# Patient Record
Sex: Female | Born: 1990 | Race: White | Hispanic: No | Marital: Single | Smoking: Former smoker
Health system: Southern US, Community
[De-identification: ages and names within clinical notes are randomized; demographics above are authoritative.]

## PROBLEM LIST (undated history)

## (undated) DIAGNOSIS — K219 Gastro-esophageal reflux disease without esophagitis: Secondary | ICD-10-CM

## (undated) DIAGNOSIS — O209 Hemorrhage in early pregnancy, unspecified: Principal | ICD-10-CM

## (undated) DIAGNOSIS — F32A Depression, unspecified: Secondary | ICD-10-CM

## (undated) DIAGNOSIS — Z3490 Encounter for supervision of normal pregnancy, unspecified, unspecified trimester: Secondary | ICD-10-CM

## (undated) DIAGNOSIS — G43909 Migraine, unspecified, not intractable, without status migrainosus: Secondary | ICD-10-CM

## (undated) DIAGNOSIS — D649 Anemia, unspecified: Secondary | ICD-10-CM

## (undated) DIAGNOSIS — F329 Major depressive disorder, single episode, unspecified: Secondary | ICD-10-CM

## (undated) DIAGNOSIS — O219 Vomiting of pregnancy, unspecified: Secondary | ICD-10-CM

## (undated) DIAGNOSIS — Z98891 History of uterine scar from previous surgery: Secondary | ICD-10-CM

## (undated) DIAGNOSIS — F419 Anxiety disorder, unspecified: Secondary | ICD-10-CM

## (undated) HISTORY — DX: Hemorrhage in early pregnancy, unspecified: O20.9

## (undated) HISTORY — PX: OTHER SURGICAL HISTORY: SHX169

## (undated) HISTORY — DX: Encounter for supervision of normal pregnancy, unspecified, unspecified trimester: Z34.90

## (undated) HISTORY — DX: Major depressive disorder, single episode, unspecified: F32.9

## (undated) HISTORY — DX: History of uterine scar from previous surgery: Z98.891

## (undated) HISTORY — DX: Vomiting of pregnancy, unspecified: O21.9

## (undated) HISTORY — DX: Depression, unspecified: F32.A

## (undated) HISTORY — DX: Anxiety disorder, unspecified: F41.9

## (undated) HISTORY — DX: Migraine, unspecified, not intractable, without status migrainosus: G43.909

---
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
## 2010-12-20 LAB — CBC
HCT: 32.9 % — ABNORMAL LOW (ref 36.0–46.0)
Hemoglobin: 11.4 g/dL — ABNORMAL LOW (ref 12.0–15.0)
Hemoglobin: 8.5 g/dL — ABNORMAL LOW (ref 12.0–15.0)
MCHC: 34.9 g/dL (ref 30.0–36.0)
MCHC: 35 g/dL (ref 30.0–36.0)
MCV: 90.2 fL (ref 78.0–100.0)
Platelets: 155 10*3/uL (ref 150–400)
Platelets: 217 10*3/uL (ref 150–400)
RDW: 12.7 % (ref 11.5–15.5)
RDW: 13 % (ref 11.5–15.5)
RDW: 13.2 % (ref 11.5–15.5)
RDW: 13.4 % (ref 11.5–15.5)
WBC: 8.4 10*3/uL (ref 4.0–10.5)

## 2010-12-20 LAB — COMPREHENSIVE METABOLIC PANEL
ALT: 19 U/L (ref 0–35)
Albumin: 2.9 g/dL — ABNORMAL LOW (ref 3.5–5.2)
Alkaline Phosphatase: 179 U/L — ABNORMAL HIGH (ref 39–117)
BUN: 5 mg/dL — ABNORMAL LOW (ref 6–23)
GFR calc Af Amer: >60 mL/min (ref >60)
Potassium: 4.3 mEq/L (ref 3.5–5.1)
Sodium: 136 mEq/L (ref 135–145)
Total Protein: 6.2 g/dL (ref 6.0–8.3)

## 2010-12-20 LAB — URIC ACID: Uric Acid, Serum: 4.1 mg/dL (ref 2.4–7.0)

## 2011-01-26 NOTE — Op Note (Signed)
NAMEPETRONELLA, Kathryn Vasquez                  ACCOUNT NO.:  [id number]   MEDICAL RECORD NO.:  [id number]          PATIENT TYPE:  INP   LOCATION:  9319                          FACILITY:  [facility]   PHYSICIAN:  Lazaro Arms, M.D.   DATE OF BIRTH:  [birth year]   DATE OF PROCEDURE:  04/09/2009  DATE OF DISCHARGE:                               OPERATIVE REPORT   PREOPERATIVE DIAGNOSES:  1. Intrauterine pregnancy at [redacted] weeks gestation.  2. Premature and prolonged rupture of membranes.  3. Positive group B strep.  4. Arrest of descent.   POSTOPERATIVE DIAGNOSES:  1. Intrauterine pregnancy at [redacted] weeks gestation.  2. Premature and prolonged rupture of membranes.  3. Positive group B strep.  4. Arrest of descent.   PROCEDURE:  Primary low-transverse cesarean section.   SURGEON:  Lazaro Arms, MD   ANESTHESIA:  Epidural.   FINDINGS:  The patient had been ruptured since approximately 4:30 a.m.  in the morning of April 08, 2009 and was subsequently induced.  She was  begun on penicillin G for group B strep prophylaxis.  The patient  progressed slowly through the first stage of labor and got to an  anterior lift approximately 3 o'clock that day, had some pain issues,  redosed her epidural and began maternal expulsive efforts.  Around 4:45  to 5 p.m., she did poorly with that, kind of pushed on and off for a  couple hours, when we had arrest, redosed her epidural, and had her push  for another window of time from about 2030 to 10 o'clock.  The fetal  heart rate tracing was reassuring throughout with occasional variables  with pushing but nothing of significance.  There was no prolonged fetal  tachycardia.  No evidence of what appeared to be heart rate tracing  consistent with sepsis.  She showed no significant progress after these  efforts and was still at +2 station with caput.  At 2210, I decided to  talk with the patient, we decided to proceed with a cesarean section for  arrest of descent.   Of  a low-transverse hysterotomy incision was delivered a viable female at  2245 with Apgars of 1, 5, and 7.  Cord pH was 7.26, three-vessel cord,  cord blood and cord gas was sent.  Neonatologist was in attendance for  routine neonatal resuscitation, which then turned into positive pressure  O2 and intubation because of poor neonatal breathing effort.  Despite  the fact, the patient got multiple doses of penicillin, the problem  right now is possible sepsis of course with a significantly prolonged  rupture of membranes.  Placenta was normal.  It was sent to Pathology  for evaluation for possible infection as well and again cord gas 7.26.   DESCRIPTION OF PROCEDURE:  The patient was taken to the operating room  where epidural have been dosed up.  She was prepped and draped in usual  sterile fashion.  A Pfannenstiel skin incision was made, carried down  sharply to the rectus fascia, scored in the midline and extended  laterally.  The fascia  taken off the muscle superiorly and inferiorly  without difficulty.  The muscles were divided.  Peritoneal cavity was  entered.  Bladder blade was placed.  A low-transverse hysterotomy  incision was made over this incision was delivered a viable female infant.  The baby was definitely wedged down to the pelvis, took great care in  placing my hand caudad to the fetal vertex, still got a small extension  down the low uterine segment, but not significant.  The infant was  handed to the neonatologist for routine neonatal resuscitation.  Cord  gas again was 7.26.  Cord blood was sent.  Placenta was delivered  spontaneously and sent to pathology.  The uterus was firm, it was closed  in two layers, first being a running interlocking layer, the second  being an imbricating layer, and again I made sure that closure was down  beyond the extension into the lower uterine segment on the left.  The  tubes and ovaries were otherwise normal.  The uterus was placed in the   peritoneal cavity.  Pelvis was irrigated vigorously.  All pedicles were  found be hemostatic.  Muscles and peritoneum reapproximated loosely.  The fascia closed using 0 Vicryl running.  Subcutaneous tissue was made  hemostatic and irrigated  Skin was closed using skin staples.  The  patient tolerated the procedure well.  She experienced 1000 mL of blood  loss, taken to recovery room in good stable condition.  All counts were  correct x3. She received Zithromax after the cord was clamped and will  receive another dose at 12 noon tomorrow.      Lazaro Arms, M.D.  Electronically Signed     LHE/MEDQ  D:  04/09/2009  T:  04/10/2009  Job:  [id number]

## 2011-01-26 NOTE — Discharge Summary (Signed)
Kathryn Vasquez, NOACK                  ACCOUNT NO.:  [id number]   MEDICAL RECORD NO.:  [id number]          PATIENT TYPE:  INP   LOCATION:  9319                          [facility]ILITY:  [facility]   PHYSICIAN:  Horton Chin, MD DATE OF BIRTH:  02/01/1991   DATE OF ADMISSION:  04/08/2009  DATE OF DISCHARGE:  04/13/2009                               DISCHARGE SUMMARY   ADMISSION DIAGNOSIS:  Intrauterine pregnancy at 41 and 2/7 weeks,  premature rupture of membranes.   DISCHARGE DIAGNOSES:  1. Status post primary low transverse cesarean section secondary to      failure to descend after over 40 hours of labor.  2. Routine postpartum course.   PERTINENT LABS:  On admission, the patient had a hemoglobin of 12.2 and  35.1.  On discharge, her hemoglobin was 6.9 with a hematocrit of 19.7.  Of note, the patient was started on ferrous sulfate and had no symptoms  of anemia.   BRIEF HOSPITAL COURSE:  The patient is an 20 year old, gravida 1, para  1, who was status post low transverse cesarean section at 20 and 3  weeks' gestation secondary to failure of descent.  The patient's  procedure was uncomplicated and performed by Dr. Duane Lope.  For  further details of this operation, please refer to separate dictated  operative report.  After the operation, the patient was noted to have a  hemoglobin of 6.9 but was asymptomatic.  There was no signs of bleeding,  pain, or any other symptoms necessitating a transfusion.  The patient  was started on ferrous sulfate and observed.  Her vital signs remained  stable and she was not symptomatic.  Throughout her postpartum course,  the patient did ambulate without difficulty.  She is tolerating regular  diet , was passing flatus and breast and bottle feeding.  On  postoperative day #4, the patient was deemed stable for discharge to  home.   DISCHARGE INSTRUCTIONS:  Routine and the patient is to follow up with  [facility] for her postpartum care.  She is to use  combined oral  contraceptive pills for birth control method and she is bottle feeding  at discharge.  She was told to return to the [facility] or to [facility] for  any further postpartum concerns.  Routine postpartum prescription  medications including the OCP were given to the patient.      Horton Chin, MD  Electronically Signed     UAA/MEDQ  D:  04/13/2009  T:  04/13/2009  Job:  [id number]

## 2012-10-20 LAB — HM PAP SMEAR: HM Pap smear: NORMAL

## 2013-03-27 ENCOUNTER — Ambulatory Visit (INDEPENDENT_AMBULATORY_CARE_PROVIDER_SITE_OTHER): Payer: Medicaid Other | Admitting: Obstetrics & Gynecology

## 2013-03-27 ENCOUNTER — Encounter: Payer: Self-pay | Admitting: Obstetrics & Gynecology

## 2013-03-27 VITALS — BP 122/76 | Ht 63.0 in | Wt 192.5 lb

## 2013-03-27 DIAGNOSIS — Z3200 Encounter for pregnancy test, result unknown: Secondary | ICD-10-CM

## 2013-03-27 DIAGNOSIS — Z3201 Encounter for pregnancy test, result positive: Secondary | ICD-10-CM

## 2013-03-27 LAB — POCT URINE PREGNANCY: Preg Test, Ur: POSITIVE

## 2013-03-27 NOTE — Progress Notes (Signed)
Patient ID: Kathryn Vasquez, female   DOB: [birth year], 22 y.o.   MRN: [id number] Pt here today for UPT, UPT was faintly positive. Pt stated she went to [facility] and was told she was pregnant, pt had been taking BC pills. They drew blood work, but did not do an [place]. Pt advised to sign a release for those records and a QHCG will be drawn today and call back in the morning for results.

## 2013-03-28 ENCOUNTER — Telehealth: Payer: Self-pay | Admitting: *Deleted

## 2013-03-28 NOTE — Telephone Encounter (Signed)
Message copied by Richardson Chiquito on Wed Mar 28, 2013  9:39 AM ------      Message from: Lazaro Arms      Created: Wed Mar 28, 2013  7:24 AM       Patient is early pregnant, needs sonogram in 2 weeks            No more HCGs needed for now            Dr Despina Hidden      ----- Message -----         From: Richardson Chiquito, LPN         Sent: 03/27/2013  10:57 AM           To: Lazaro Arms, MD                   ------

## 2013-03-28 NOTE — Telephone Encounter (Signed)
Pt aware of results and aware to make appointment for [place] in 2 weeks. Pt given an appointment.

## 2013-04-05 ENCOUNTER — Other Ambulatory Visit: Payer: Self-pay | Admitting: Obstetrics & Gynecology

## 2013-04-05 DIAGNOSIS — O3680X0 Pregnancy with inconclusive fetal viability, not applicable or unspecified: Secondary | ICD-10-CM

## 2013-04-10 ENCOUNTER — Ambulatory Visit (INDEPENDENT_AMBULATORY_CARE_PROVIDER_SITE_OTHER): Payer: Medicaid Other | Admitting: Women's Health

## 2013-04-10 ENCOUNTER — Encounter: Payer: Self-pay | Admitting: Women's Health

## 2013-04-10 ENCOUNTER — Ambulatory Visit (INDEPENDENT_AMBULATORY_CARE_PROVIDER_SITE_OTHER): Payer: Medicaid Other

## 2013-04-10 ENCOUNTER — Encounter: Payer: Self-pay | Admitting: Obstetrics & Gynecology

## 2013-04-10 VITALS — BP 120/80 | Ht 63.0 in | Wt 194.0 lb

## 2013-04-10 DIAGNOSIS — O3680X0 Pregnancy with inconclusive fetal viability, not applicable or unspecified: Secondary | ICD-10-CM

## 2013-04-10 DIAGNOSIS — O34219 Maternal care for unspecified type scar from previous cesarean delivery: Secondary | ICD-10-CM

## 2013-04-10 DIAGNOSIS — Z1389 Encounter for screening for other disorder: Secondary | ICD-10-CM

## 2013-04-10 DIAGNOSIS — Z3481 Encounter for supervision of other normal pregnancy, first trimester: Secondary | ICD-10-CM

## 2013-04-10 DIAGNOSIS — O210 Mild hyperemesis gravidarum: Secondary | ICD-10-CM

## 2013-04-10 DIAGNOSIS — Z331 Pregnant state, incidental: Secondary | ICD-10-CM

## 2013-04-10 DIAGNOSIS — Z3480 Encounter for supervision of other normal pregnancy, unspecified trimester: Secondary | ICD-10-CM | POA: Insufficient documentation

## 2013-04-10 LAB — POCT URINALYSIS DIPSTICK
Blood, UA: NEGATIVE
Protein, UA: NEGATIVE

## 2013-04-10 NOTE — Progress Notes (Signed)
Patient ID: Marisela Line, female   DOB: 1991-06-26, 22 y.o.   MRN: [id number] Chanah Tidmore is a 22 y.o. G60P1001 female @ [redacted]w[redacted]d who is here today for confirmatory u/s and visit to discuss results. Was taking COCs, states she didn't miss any pills.  Started prenatal vitamins already. Reports n/v, migraines, cramping. Denies vb.  O: BP 120/80  Ht 5\' 3"  (1.6 m)  Wt 194 lb (87.998 kg)  BMI 34.37 kg/m2  LMP 02/04/2013      U/S IUP @ 5.4wks by CRL  A: Early IUP @ 5.4wks     N/V pregnancy- declined antiemetics     Cramping     Migraines  P: Reviewed n/v relief measures- to call if decides wants antiemetics     Reviewed warning s/s to report     Reviewed migraine prevention measures     F/U 4wks for new OB visit  Cheral Marker, CNM, [facility] 04/10/2013 3:23 PM

## 2013-04-10 NOTE — Progress Notes (Signed)
U/S-single IUP with +FCA noted, CRL c/w 5+4wks EDD 12/07/2013, cx long and closed, bilateral adnexa WNL

## 2013-04-10 NOTE — Patient Instructions (Signed)
Nausea & Vomiting  Have saltine crackers or pretzels by your bed and eat a few bites before you raise your head out of bed in the morning  Eat small frequent meals throughout the day instead of large meals  Drink plenty of fluids throughout the day to stay hydrated, just don't drink a lot of fluids with your meals.  This can make your stomach fill up faster making you feel sick  Do not brush your teeth right after you eat  Products with real ginger are good for nausea, like ginger ale and ginger hard candy Make sure it says made with real ginger!  Sucking on sour candy like lemon heads is also good for nausea  If your prenatal vitamins make you nauseated, take them at night so you will sleep through the nausea  If you feel like you need medicine for the nausea & vomiting please let us know  If you are unable to keep any fluids or food down please let us know   Migraine Headache A migraine headache is an intense, throbbing pain on one or both sides of your head. A migraine can last for 30 minutes to several hours. CAUSES  The exact cause of a migraine headache is not always known. However, a migraine may be caused when nerves in the brain become irritated and release chemicals that cause inflammation. This causes pain. SYMPTOMS  Pain on one or both sides of your head.  Pulsating or throbbing pain.  Severe pain that prevents daily activities.  Pain that is aggravated by any physical activity.  Nausea, vomiting, or both.  Dizziness.  Pain with exposure to bright lights, loud noises, or activity.  General sensitivity to bright lights, loud noises, or smells. Before you get a migraine, you may get warning signs that a migraine is coming (aura). An aura may include:  Seeing flashing lights.  Seeing bright spots, halos, or zig-zag lines.  Having tunnel vision or blurred vision.  Having feelings of numbness or tingling.  Having trouble talking.  Having muscle  weakness. MIGRAINE TRIGGERS  Alcohol.  Smoking.  Stress.  Menstruation.  Aged cheeses.  Foods or drinks that contain nitrates, glutamate, aspartame, or tyramine.  Lack of sleep.  Chocolate.  Caffeine.  Hunger.  Physical exertion.  Fatigue.  Medicines used to treat chest pain (nitroglycerine), birth control pills, estrogen, and some blood pressure medicines. DIAGNOSIS  A migraine headache is often diagnosed based on:  Symptoms.  Physical examination.  A CT scan or MRI of your head. TREATMENT Medicines may be given for pain and nausea. Medicines can also be given to help prevent recurrent migraines.  HOME CARE INSTRUCTIONS  Only take over-the-counter or prescription medicines for pain or discomfort as directed by your caregiver. The use of long-term narcotics is not recommended.  Lie down in a dark, quiet room when you have a migraine.  Keep a journal to find out what may trigger your migraine headaches. For example, write down:  What you eat and drink.  How much sleep you get.  Any change to your diet or medicines.  Limit alcohol consumption.  Quit smoking if you smoke.  Get 7 to 9 hours of sleep, or as recommended by your caregiver.  Limit stress.  Keep lights dim if bright lights bother you and make your migraines worse. SEEK IMMEDIATE MEDICAL CARE IF:   Your migraine becomes severe.  You have a fever.  You have a stiff neck.  You have vision loss.  You  have muscular weakness or loss of muscle control.  You start losing your balance or have trouble walking.  You feel faint or pass out.  You have severe symptoms that are different from your first symptoms. MAKE SURE YOU:   Understand these instructions.  Will watch your condition.  Will get help right away if you are not doing well or get worse. Document Released: 08/30/2005 Document Revised: 11/22/2011 Document Reviewed: 08/20/2011 [facility] Patient Information 2014 [place],  [place].

## 2013-04-13 ENCOUNTER — Telehealth: Payer: Self-pay | Admitting: *Deleted

## 2013-04-13 DIAGNOSIS — Z3481 Encounter for supervision of other normal pregnancy, first trimester: Secondary | ICD-10-CM

## 2013-04-13 NOTE — Telephone Encounter (Signed)
Spotting for the past two to three days, a peach color. Had blood in stool this morning. Pt states she is having some cramping. Has noticed some discharge this morning with a stream of light red blood in it. Pt advised to push fluids and rest and to call us back if things change or get worse. Pt advised that we have a nurse line to call after hours and during the weekend. Pt verbalized understanding.   I spoke with Dr. Emelda Fear about above and he advised for the pt to do all the above and to call us back on Monday morning for an update. I called the pt back and advised her of Dr. Rayna Sexton instructions and she agreed to call us back on Monday morning. Pt verbalized understanding.

## 2013-04-14 ENCOUNTER — Emergency Department (HOSPITAL_COMMUNITY): Payer: Medicaid Other

## 2013-04-14 ENCOUNTER — Emergency Department (HOSPITAL_COMMUNITY)
Admission: EM | Admit: 2013-04-14 | Discharge: 2013-04-14 | Disposition: A | Discharge status: Home / Self Care | Payer: Medicaid Other | Attending: Emergency Medicine | Admitting: Emergency Medicine

## 2013-04-14 ENCOUNTER — Encounter (HOSPITAL_COMMUNITY): Payer: Self-pay | Admitting: *Deleted

## 2013-04-14 DIAGNOSIS — Z91040 Latex allergy status: Secondary | ICD-10-CM | POA: Insufficient documentation

## 2013-04-14 DIAGNOSIS — M549 Dorsalgia, unspecified: Secondary | ICD-10-CM | POA: Insufficient documentation

## 2013-04-14 DIAGNOSIS — Z8659 Personal history of other mental and behavioral disorders: Secondary | ICD-10-CM | POA: Insufficient documentation

## 2013-04-14 DIAGNOSIS — F411 Generalized anxiety disorder: Secondary | ICD-10-CM | POA: Insufficient documentation

## 2013-04-14 DIAGNOSIS — Z87891 Personal history of nicotine dependence: Secondary | ICD-10-CM | POA: Insufficient documentation

## 2013-04-14 DIAGNOSIS — O99340 Other mental disorders complicating pregnancy, unspecified trimester: Secondary | ICD-10-CM | POA: Insufficient documentation

## 2013-04-14 DIAGNOSIS — O210 Mild hyperemesis gravidarum: Secondary | ICD-10-CM | POA: Insufficient documentation

## 2013-04-14 DIAGNOSIS — O9989 Other specified diseases and conditions complicating pregnancy, childbirth and the puerperium: Secondary | ICD-10-CM | POA: Insufficient documentation

## 2013-04-14 DIAGNOSIS — O209 Hemorrhage in early pregnancy, unspecified: Secondary | ICD-10-CM | POA: Insufficient documentation

## 2013-04-14 DIAGNOSIS — Z8679 Personal history of other diseases of the circulatory system: Secondary | ICD-10-CM | POA: Insufficient documentation

## 2013-04-14 DIAGNOSIS — R109 Unspecified abdominal pain: Secondary | ICD-10-CM | POA: Insufficient documentation

## 2013-04-14 LAB — CBC WITH DIFFERENTIAL/PLATELET
Basophils Absolute: 0 10*3/uL (ref 0.0–0.1)
Eosinophils Absolute: 0.1 10*3/uL (ref 0.0–0.7)
Eosinophils Relative: 1 % (ref 0–5)
MCH: 28.9 pg (ref 26.0–34.0)
MCHC: 34.5 g/dL (ref 30.0–36.0)
MCV: 83.7 fL (ref 78.0–100.0)
Platelets: 339 10*3/uL (ref 150–400)
RDW: 12.6 % (ref 11.5–15.5)

## 2013-04-14 LAB — ABO/RH: ABO/RH(D): O POS

## 2013-04-14 NOTE — ED Notes (Signed)
Pt with abd cramping, states pain radiates from abd to pelvis into bilateral thighs, denies passing any clots but blood tinged discharge

## 2013-04-14 NOTE — ED Notes (Signed)
Pt reports she is [redacted] weeks pregnant and has had increasing abdominal cramping and spotting since Wednesday.

## 2013-04-14 NOTE — ED Provider Notes (Signed)
CSN: [id number]     Arrival date & time 04/14/13  1923 History     First MD Initiated Contact with Patient 04/14/13 1950     Chief Complaint  Patient presents with  . Vaginal Bleeding   (Consider location/radiation/quality/duration/timing/severity/associated sxs/prior Treatment) Patient is a 22 y.o. female presenting with vaginal bleeding. The history is provided by the patient.  Vaginal Bleeding Quality:  Bright red Severity:  Mild Onset quality:  Gradual Duration:  3 days Timing:  Intermittent Progression:  Worsening Chronicity:  New Possible pregnancy: yes   Context: during urination and spontaneously   Relieved by:  Nothing Worsened by:  Nothing tried Associated symptoms: abdominal pain, back pain and nausea   Associated symptoms: no dizziness, no dysuria and no fever   Risk factors: gynecological surgery (c/s)   Risk factors: no STD     Kathryn Vasquez is a 22 y.o. G 2, P1 @ [redacted] weeks gestation who presents to the ED with vaginal bleeding. The bleeding started 3 days ago. Associated symptoms include abdominal cramping, nausea and vomiting.    Past Medical History  Diagnosis Date  . Migraine   . Depression   . Anxiety    Past Surgical History  Procedure Laterality Date  . Cesarean section     Family History  Problem Relation Age of Onset  . Cancer Maternal Grandmother     breast  . Hypertension Mother    History  Substance Use Topics  . Smoking status: Former Smoker    Types: Cigarettes  . Smokeless tobacco: Never Used  . Alcohol Use: No   OB History   Grav Para Term Preterm Abortions TAB SAB Ect Mult Living   2 1 1       1      Review of Systems  Constitutional: Negative for fever.  Gastrointestinal: Positive for nausea and abdominal pain.  Genitourinary: Positive for vaginal bleeding. Negative for dysuria.  Musculoskeletal: Positive for back pain.  Neurological: Negative for dizziness.    Allergies  Ceclor; Keflex; and Latex  Home Medications    Current Outpatient Rx  Name  Route  Sig  Dispense  Refill  . prenatal vitamin w/FE, FA (NATACHEW) 29-1 MG CHEW   Oral   Chew 1 tablet by mouth daily at 12 noon.          BP 132/73  Pulse 98  Temp(Src) 99 F (37.2 C) (Oral)  Resp 18  Ht 5\' 3"  (1.6 m)  Wt 193 lb (87.544 kg)  BMI 34.2 kg/m2  SpO2 99%  LMP 02/04/2013 Physical Exam  Nursing note and vitals reviewed. Constitutional: She is oriented to person, place, and time. She appears well-developed and well-nourished. No distress.  HENT:  Head: Normocephalic.  Eyes: EOM are normal.  Neck: Neck supple.  Cardiovascular: Normal rate.   Pulmonary/Chest: Effort normal.  Abdominal: Soft. There is tenderness.  Minimal tenderness lower abdomen with palpation  Genitourinary:  External genitalia without lesions. Scant blood vaginal vault. Cervix closed, long, no adnexal mass palpated, uterus slightly enlarged.  Musculoskeletal: Normal range of motion.  Neurological: She is alert and oriented to person, place, and time. No cranial nerve deficit.  Skin: Skin is warm and dry.  Psychiatric: Her behavior is normal. Her mood appears anxious.    ED Course  US Ob Comp Less 14 Wks  04/14/2013   *RADIOLOGY REPORT*  Clinical Data: Pregnant, pelvic pain  OBSTETRIC <14 WK [place] AND TRANSVAGINAL OB US  Technique:  Both transabdominal and transvaginal ultrasound  examinations were performed for complete evaluation of the gestation as well as the maternal uterus, adnexal regions, and pelvic cul-de-sac.  Transvaginal technique was performed to assess early pregnancy.  Comparison:  None.  Intrauterine gestational sac:  Visualized/normal in shape. Yolk sac: Present Embryo: Present Cardiac Activity: Present Heart Rate: 113 bpm  CRL: 4.7  mm  6 w  1 d        [place] EDC: 12/07/2013  Maternal uterus/adnexae: No subchronic hemorrhage.  Right ovary is within normal limits, measuring 2.3 x 1.2 x 1.6 cm.  Left ovary is within normal limits, measuring 3.3 x 1.3 x 2.9 cm.   No free fluid.  IMPRESSION: Single live intrauterine gestation with estimated gestational age [redacted] weeks 1 day by crown-rump length.   Original Report Authenticated By: Charline Bills, M.D.   US Ob Transvaginal  04/14/2013   *RADIOLOGY REPORT*  Clinical Data: Pregnant, pelvic pain  OBSTETRIC <14 WK [place] AND TRANSVAGINAL OB US  Technique:  Both transabdominal and transvaginal ultrasound examinations were performed for complete evaluation of the gestation as well as the maternal uterus, adnexal regions, and pelvic cul-de-sac.  Transvaginal technique was performed to assess early pregnancy.  Comparison:  None.  Intrauterine gestational sac:  Visualized/normal in shape. Yolk sac: Present Embryo: Present Cardiac Activity: Present Heart Rate: 113 bpm  CRL: 4.7  mm  6 w  1 d        [place] EDC: 12/07/2013  Maternal uterus/adnexae: No subchronic hemorrhage.  Right ovary is within normal limits, measuring 2.3 x 1.2 x 1.6 cm.  Left ovary is within normal limits, measuring 3.3 x 1.3 x 2.9 cm.  No free fluid.  IMPRESSION: Single live intrauterine gestation with estimated gestational age [redacted] weeks 1 day by crown-rump length.   Original Report Authenticated By: Charline Bills, M.D.    Procedures   MDM  22 y.o. female with bleeding in early pregnancy. Viable IUP on ultrasound.  Will instruct patient on threatened AB and she will follow up in the office at [facility]. She will return here as needed. I have reviewed this patient's vital signs, nurses notes, appropriate labs and imaging.  I have discussed findings with the patient and plan of care and she voices understanding.  Janne Napoleon, [place] 04/15/13 2257

## 2013-04-16 ENCOUNTER — Encounter: Payer: Self-pay | Admitting: Adult Health

## 2013-04-16 ENCOUNTER — Ambulatory Visit (INDEPENDENT_AMBULATORY_CARE_PROVIDER_SITE_OTHER): Payer: Medicaid Other | Admitting: Adult Health

## 2013-04-16 ENCOUNTER — Telehealth: Payer: Self-pay | Admitting: Obstetrics and Gynecology

## 2013-04-16 VITALS — BP 130/80 | Wt 195.5 lb

## 2013-04-16 DIAGNOSIS — O9989 Other specified diseases and conditions complicating pregnancy, childbirth and the puerperium: Secondary | ICD-10-CM

## 2013-04-16 DIAGNOSIS — O34219 Maternal care for unspecified type scar from previous cesarean delivery: Secondary | ICD-10-CM

## 2013-04-16 DIAGNOSIS — Z331 Pregnant state, incidental: Secondary | ICD-10-CM

## 2013-04-16 DIAGNOSIS — O209 Hemorrhage in early pregnancy, unspecified: Secondary | ICD-10-CM

## 2013-04-16 DIAGNOSIS — Z1389 Encounter for screening for other disorder: Secondary | ICD-10-CM

## 2013-04-16 HISTORY — DX: Hemorrhage in early pregnancy, unspecified: O20.9

## 2013-04-16 LAB — POCT URINALYSIS DIPSTICK
Ketones, UA: NEGATIVE
Leukocytes, UA: NEGATIVE
Nitrite, UA: NEGATIVE
Protein, UA: NEGATIVE

## 2013-04-16 LAB — GC/CHLAMYDIA PROBE AMP: CT Probe RNA: NEGATIVE

## 2013-04-16 NOTE — Patient Instructions (Addendum)
Vaginal Bleeding During Pregnancy, First Trimester A small amount of bleeding (spotting) is relatively common in early pregnancy. It usually stops on its own. There are many causes for bleeding or spotting in early pregnancy. Some bleeding may be related to the pregnancy and some may not. Cramping with the bleeding is more serious and concerning. Tell your caregiver if you have any vaginal bleeding.  CAUSES   It is normal in most cases.  The pregnancy ends (miscarriage).  The pregnancy may end (threatened miscarriage).  Infection or inflammation of the cervix.  Growths (polyps) on the cervix.  Pregnancy happens outside of the uterus and in a fallopian tube (tubal pregnancy).  Many tiny cysts in the uterus instead of pregnancy tissue (molar pregnancy). SYMPTOMS  Vaginal bleeding or spotting with or without cramps. DIAGNOSIS  To evaluate the pregnancy, your caregiver may:  Do a pelvic exam.  Take blood tests.  Do an ultrasound. It is very important to follow your caregiver's instructions.  TREATMENT   Evaluation of the pregnancy with blood tests and ultrasound.  Bed rest (getting up to use the bathroom only).  Rho-gam immunization if the mother is Rh negative and the father is Rh positive. HOME CARE INSTRUCTIONS   If your caregiver orders bed rest, you may need to make arrangements for the care of other children and for other responsibilities. However, your caregiver may allow you to continue light activity.  Keep track of the number of pads you use each day, how often you change pads and how soaked (saturated) they are. Write this down.  Do not use tampons. Do not douche.  Do not have sexual intercourse or orgasms until approved by your physician.  Save any tissue that you pass for your caregiver to see.  Take medicine for cramps only with your caregiver's permission.  Do not take aspirin because it can make you bleed. SEEK IMMEDIATE MEDICAL CARE IF:   You  experience severe cramps in your stomach, back or belly (abdomen).  You have an oral temperature above 102 F (38.9 C), not controlled by medicine.  You pass large clots or tissue.  Your bleeding increases or you become light-headed, weak or have fainting episodes.  You develop chills.  You are leaking or have a gush of fluid from your vagina.  You pass out while having a bowel movement. That may mean you have a ruptured tubal pregnancy. Document Released: 06/09/2005 Document Revised: 11/22/2011 Document Reviewed: 12/19/2008 [facility] Patient Information 2014 [place], [place]. No sex Follow up as scheduled  Push fluids

## 2013-04-16 NOTE — Progress Notes (Signed)
Cramping and spotting.

## 2013-04-16 NOTE — Telephone Encounter (Signed)
Pt called on Friday, August 1,2014 stating having vaginal spotting and pregnant. Pt went to [facility] on Saturday and told to followup here this week. Called transferred to front staff for an appt to be made with Cyril Mourning, NP.

## 2013-04-16 NOTE — Progress Notes (Signed)
Kathryn Vasquez was seen 8/2 at [facility] for bleeding and had [place] that showed 4.7cm CRL and +FHM [facility] 3/27 and she is follow up today still wiping blood and some cramps US showed +FHM 125.No sex push fluids eat often and try hard candy for nausea declines meds at present.She got pregnant on the pill.Return 8/26 for new OB or before if needed.

## 2013-04-17 NOTE — ED Provider Notes (Signed)
Medical screening examination/treatment/procedure(s) were performed by non-physician practitioner and as supervising physician I was immediately available for consultation/collaboration.   Laray Anger, DO 04/17/13 1247

## 2013-05-05 ENCOUNTER — Encounter (HOSPITAL_COMMUNITY): Payer: Self-pay

## 2013-05-05 ENCOUNTER — Emergency Department (HOSPITAL_COMMUNITY)
Admission: EM | Admit: 2013-05-05 | Discharge: 2013-05-05 | Disposition: A | Discharge status: Home / Self Care | Payer: Medicaid Other | Attending: Emergency Medicine | Admitting: Emergency Medicine

## 2013-05-05 DIAGNOSIS — Z9889 Other specified postprocedural states: Secondary | ICD-10-CM | POA: Insufficient documentation

## 2013-05-05 DIAGNOSIS — O200 Threatened abortion: Secondary | ICD-10-CM

## 2013-05-05 DIAGNOSIS — Z79899 Other long term (current) drug therapy: Secondary | ICD-10-CM | POA: Insufficient documentation

## 2013-05-05 DIAGNOSIS — O209 Hemorrhage in early pregnancy, unspecified: Secondary | ICD-10-CM

## 2013-05-05 DIAGNOSIS — R52 Pain, unspecified: Secondary | ICD-10-CM

## 2013-05-05 DIAGNOSIS — Z8659 Personal history of other mental and behavioral disorders: Secondary | ICD-10-CM | POA: Insufficient documentation

## 2013-05-05 DIAGNOSIS — Z91040 Latex allergy status: Secondary | ICD-10-CM | POA: Insufficient documentation

## 2013-05-05 DIAGNOSIS — Z8679 Personal history of other diseases of the circulatory system: Secondary | ICD-10-CM | POA: Insufficient documentation

## 2013-05-05 HISTORY — DX: Encounter for supervision of normal pregnancy, unspecified, unspecified trimester: Z34.90

## 2013-05-05 LAB — CBC WITH DIFFERENTIAL/PLATELET
Basophils Absolute: 0 10*3/uL (ref 0.0–0.1)
Basophils Relative: 0 % (ref 0–1)
Eosinophils Absolute: 0.1 10*3/uL (ref 0.0–0.7)
Hemoglobin: 12.5 g/dL (ref 12.0–15.0)
MCH: 29 pg (ref 26.0–34.0)
MCHC: 34.9 g/dL (ref 30.0–36.0)
Monocytes Relative: 9 % (ref 3–12)
Neutro Abs: 3.1 10*3/uL (ref 1.7–7.7)
Neutrophils Relative %: 45 % (ref 43–77)
Platelets: 273 10*3/uL (ref 150–400)
RDW: 12.5 % (ref 11.5–15.5)

## 2013-05-05 LAB — URINALYSIS, ROUTINE W REFLEX MICROSCOPIC
Ketones, ur: NEGATIVE mg/dL
Leukocytes, UA: NEGATIVE
Nitrite: NEGATIVE
Urobilinogen, UA: 0.2 mg/dL (ref 0.0–1.0)
pH: 6 (ref 5.0–8.0)

## 2013-05-05 LAB — URINE MICROSCOPIC-ADD ON

## 2013-05-05 NOTE — ED Notes (Signed)
Family states pt passed a clot, made EDP aware. EDP states clot ok, pt tobe discharged &  have [place] in the morning. Follow up w/ OB/GYN Monday.

## 2013-05-05 NOTE — ED Notes (Addendum)
Pt states she is approx [redacted] weeks pregnant and has been bleeding x 4 days, has not contacted her ob/gyn regarding same.  Pt states she is also having abd cramping

## 2013-05-05 NOTE — ED Notes (Signed)
Pt alert & oriented x4, stable gait. Patient given discharge instructions, paperwork & prescription(s). Patient  instructed to stop at the registration desk to finish any additional paperwork. Patient verbalized understanding. Pt left department w/ no further questions. 

## 2013-05-05 NOTE — ED Notes (Signed)
Pt reports pelvic pain on & off for the past four days. Pt has not called ob/gyn, has appt on tues. Pt states bleeding is constant, every time she wipes.

## 2013-05-05 NOTE — ED Provider Notes (Signed)
CSN: [id number]     Arrival date & time 05/05/13  2110 History  This chart was scribed for Hurman Horn, MD, by Yevette Edwards, ED Scribe. This patient was seen in room APA08/APA08 and the patient's care was started at 9:46 PM.   First MD Initiated Contact with Patient 05/05/13 2144     Chief Complaint  Patient presents with  . Vaginal Bleeding  . Pregnancy comp    The history is provided by the patient. No language interpreter was used.   HPI Comments: Kathryn Vasquez is a 22 y.o. female who presents to the Emergency Department complaining of vaginal bleeding which has been occurring for four days. This is her second pregnancy; she is nine weeks along. She denies passing any tissue or clots, and she denies experiencing any fever, emesis, diarrhea, syncope, or light-headedness. Three weeks ago, she experienced light bleeding which spontaneously resolved after three days of intermittent, light bleeding. The pt does not recall bleeding during her first pregnancy. She also does not recall receiving any Rh shots during her pregnancy.   Opos blood type confirmed by lab result. [place] confirmed IUP at [redacted] weeks EGA. She has an appointment in three days with her ob-gyn.  Past Medical History  Diagnosis Date  . Migraine   . Depression   . Anxiety   . First trimester bleeding 04/16/2013  . Pregnancy    Past Surgical History  Procedure Laterality Date  . Cesarean section     Family History  Problem Relation Age of Onset  . Cancer Maternal Grandmother     breast  . Hypertension Mother    History  Substance Use Topics  . Smoking status: Never Smoker   . Smokeless tobacco: Never Used  . Alcohol Use: No   OB History   Grav Para Term Preterm Abortions TAB SAB Ect Mult Living   2 1 1       1      Review of Systems  A complete 10 system review of systems was obtained, and all systems were negative except where indicated in the HPI and PE.   Allergies  Ceclor; Keflex; and Latex  Home  Medications   Current Outpatient Rx  Name  Route  Sig  Dispense  Refill  . prenatal vitamin w/FE, FA (NATACHEW) 29-1 MG CHEW   Oral   Chew 1 tablet by mouth daily at 12 noon.          Triage Vitals: BP 139/85  Pulse 90  Temp(Src) 99.1 F (37.3 C) (Oral)  Resp 16  Ht 5\' 3"  (1.6 m)  Wt 196 lb (88.905 kg)  BMI 34.73 kg/m2  SpO2 100%  LMP 02/04/2013  Physical Exam  Nursing note and vitals reviewed. Constitutional:  Awake, alert, nontoxic appearance.  HENT:  Head: Atraumatic.  Eyes: Right eye exhibits no discharge. Left eye exhibits no discharge.  Neck: Neck supple.  Cardiovascular: Normal rate and regular rhythm.   No murmur heard. Pulmonary/Chest: Effort normal and breath sounds normal. No respiratory distress. She exhibits no tenderness.  Abdominal: Soft. Bowel sounds are normal. There is no tenderness. There is no rebound.  Genitourinary:  Performed a manual pelvic exam. Chaperone present. Scant blood on examination glove. Cervix closed. Mild, diffused tenderness.  Musculoskeletal: She exhibits no tenderness.  Baseline ROM, no obvious new focal weakness.  Neurological:  Mental status and motor strength appears baseline for patient and situation.  Skin: No rash noted.  Psychiatric: She has a normal mood and affect.  ED Course   DIAGNOSTIC STUDIES:  Oxygen Saturation is 100% on room air, normal by my interpretation.    COORDINATION OF CARE:  9:58 PM- Patient / Family understand and agree with initial ED impression and plan with expectations set for ED visit.  Procedures (including critical care time)  9:55 PM-  Performed an abdominal ultra sound.   10:12 PM- Re-performed an abdominal ultra sound.  10:15 PM- Performed a bimanual pelvic exam. Chaperone present. Scant blood on examination glove. Cervix closed. Mild, diffuse tenderness.  Labs Reviewed  URINALYSIS, ROUTINE W REFLEX MICROSCOPIC - Abnormal; Notable for the following:    Specific Gravity, Urine  <1.005 (*)    Hgb urine dipstick MODERATE (*)    All other components within normal limits  CBC WITH DIFFERENTIAL - Abnormal; Notable for the following:    HCT 35.8 (*)    All other components within normal limits  URINE MICROSCOPIC-ADD ON - Abnormal; Notable for the following:    Squamous Epithelial / LPF FEW (*)    All other components within normal limits   US Ob Comp Less 14 Wks  05/06/2013   *RADIOLOGY REPORT*  Clinical Data: Pain, vaginal bleeding, pregnant  OBSTETRIC <14 WK [place] AND TRANSVAGINAL OB US  Technique:  Both transabdominal and transvaginal ultrasound examinations were performed for complete evaluation of the gestation as well as the maternal uterus, adnexal regions, and pelvic cul-de-sac.  Transvaginal technique was performed to assess early pregnancy.  Comparison:  04/14/2013  Intrauterine gestational sac:  Visualized/normal in shape. Yolk sac: Present Embryo: Present Cardiac Activity: Not identified Heart Rate: N/A bpm  MSD: 24.6 mm  7 w 4 d CRL: 11.4  mm  7 w  2 d             [place] EDC: 12/21/2013  Maternal uterus/adnexae: Chorioamniotic separation. Small amount of subchorionic hemorrhage. No uterine mass. Ovaries and adnexae unremarkable.  IMPRESSION: Single intrauterine gestation measured at 7 weeks 2 days EGA by crown-rump length. However no fetal cardiac activity is identified compatible with fetal nonviability.   Original Report Authenticated By: Ulyses Southward, M.D.   US Ob Transvaginal  05/06/2013   *RADIOLOGY REPORT*  Clinical Data: Pain, vaginal bleeding, pregnant  OBSTETRIC <14 WK [place] AND TRANSVAGINAL OB US  Technique:  Both transabdominal and transvaginal ultrasound examinations were performed for complete evaluation of the gestation as well as the maternal uterus, adnexal regions, and pelvic cul-de-sac.  Transvaginal technique was performed to assess early pregnancy.  Comparison:  04/14/2013  Intrauterine gestational sac:  Visualized/normal in shape. Yolk sac: Present Embryo:  Present Cardiac Activity: Not identified Heart Rate: N/A bpm  MSD: 24.6 mm  7 w 4 d CRL: 11.4  mm  7 w  2 d             [place] EDC: 12/21/2013  Maternal uterus/adnexae: Chorioamniotic separation. Small amount of subchorionic hemorrhage. No uterine mass. Ovaries and adnexae unremarkable.  IMPRESSION: Single intrauterine gestation measured at 7 weeks 2 days EGA by crown-rump length. However no fetal cardiac activity is identified compatible with fetal nonviability.   Original Report Authenticated By: Ulyses Southward, M.D.   1. Threatened miscarriage in early pregnancy   2. First trimester bleeding   3. Pain     MDM   I doubt any other EMC precluding discharge at this time including, but not necessarily limited to the following:ectopic.  I personally performed the services described in this documentation, which was scribed in my presence. The recorded information has  been reviewed and is accurate.   Hurman Horn, MD 05/06/13 [phone]

## 2013-05-06 ENCOUNTER — Other Ambulatory Visit (HOSPITAL_COMMUNITY): Payer: Self-pay | Admitting: Emergency Medicine

## 2013-05-06 ENCOUNTER — Ambulatory Visit (HOSPITAL_COMMUNITY)
Admit: 2013-05-06 | Discharge: 2013-05-06 | Disposition: A | Discharge status: Home / Self Care | Payer: Medicaid Other | Attending: Emergency Medicine | Admitting: Emergency Medicine

## 2013-05-06 DIAGNOSIS — R52 Pain, unspecified: Secondary | ICD-10-CM

## 2013-05-06 DIAGNOSIS — O209 Hemorrhage in early pregnancy, unspecified: Secondary | ICD-10-CM | POA: Insufficient documentation

## 2013-05-06 DIAGNOSIS — R109 Unspecified abdominal pain: Secondary | ICD-10-CM | POA: Insufficient documentation

## 2013-05-06 NOTE — ED Provider Notes (Signed)
Pt returns for outpatient [place] for pregnancy and vaginal bleeding. She is G2P1Ab0, about 9 weeks and states they have already heard the baby heart beat in Dr Forestine Chute office. She reports she has been having lower abdominal cramping like period cramping and when she wipes she sees blood. Today she started to wear a pad and has only had one. She has an appt in 3 days with Dr Despina Hidden. She was seen in the ED last night.   Pt given results of her test, advised to call Dr Forestine Chute office today to let him know about her [place] report. Advised to come back if she gets heavy bleeding or worsening pain.   US Ob Comp Less 14 Wks US Ob Transvaginal  05/06/2013   *RADIOLOGY REPORT*  Clinical Data: Pain, vaginal bleeding, pregnant  OBSTETRIC <14 WK [place] AND TRANSVAGINAL OB US  Technique:  Both transabdominal and transvaginal ultrasound examinations were performed for complete evaluation of the gestation as well as the maternal uterus, adnexal regions, and pelvic cul-de-sac.  Transvaginal technique was performed to assess early pregnancy.  Comparison:  04/14/2013  Intrauterine gestational sac:  Visualized/normal in shape. Yolk sac: Present Embryo: Present Cardiac Activity: Not identified Heart Rate: N/A bpm  MSD: 24.6 mm  7 w 4 d CRL: 11.4  mm  7 w  2 d             [place] EDC: 12/21/2013  Maternal uterus/adnexae: Chorioamniotic separation. Small amount of subchorionic hemorrhage. No uterine mass. Ovaries and adnexae unremarkable.  IMPRESSION: Single intrauterine gestation measured at 7 weeks 2 days EGA by crown-rump length. However no fetal cardiac activity is identified compatible with fetal nonviability.   Original Report Authenticated By: Ulyses Southward, M.D.    Ward Givens, MD 05/06/13 1248

## 2013-05-08 ENCOUNTER — Ambulatory Visit (INDEPENDENT_AMBULATORY_CARE_PROVIDER_SITE_OTHER): Payer: Medicaid Other | Admitting: Advanced Practice Midwife

## 2013-05-08 VITALS — BP 128/84 | Wt 195.0 lb

## 2013-05-08 DIAGNOSIS — O021 Missed abortion: Secondary | ICD-10-CM

## 2013-05-08 DIAGNOSIS — Z3480 Encounter for supervision of other normal pregnancy, unspecified trimester: Secondary | ICD-10-CM

## 2013-05-08 DIAGNOSIS — O209 Hemorrhage in early pregnancy, unspecified: Secondary | ICD-10-CM

## 2013-05-08 DIAGNOSIS — Z1389 Encounter for screening for other disorder: Secondary | ICD-10-CM

## 2013-05-08 DIAGNOSIS — Z331 Pregnant state, incidental: Secondary | ICD-10-CM

## 2013-05-08 LAB — POCT URINALYSIS DIPSTICK
Blood, UA: 4
Glucose, UA: NEGATIVE

## 2013-05-08 MED ORDER — HYDROCODONE-ACETAMINOPHEN 5-325 MG PO TABS: 1.0000 | ORAL_TABLET | Freq: Four times a day (QID) | ORAL | Status: DC | PRN

## 2013-05-08 MED ORDER — MISOPROSTOL 200 MCG PO TABS: 800.0000 ug | ORAL_TABLET | Freq: Once | ORAL | Status: DC

## 2013-05-08 NOTE — Progress Notes (Signed)
Pt states seen at [facility] ER for vaginal bleeding and abdominal pain. Pt states was told at [facility] ER was unable to find heart beat. U/S reviewed and no FCA.  Vaginal u/s today shows 7 week size fetus w/o FCA. Small amount of vag bleeding (although she says it has been heavy at times over the past 2 days) and cx closed. Options (wait, cytotec,D&C) discussed.  Pt opted for cytotec per vagina.  Rx for Norco 5/325 #30 and instructions given. Pt aware that it may take up to 48 hours and we may need to repeat cytotec.

## 2013-05-08 NOTE — Patient Instructions (Addendum)

## 2013-05-22 ENCOUNTER — Encounter (INDEPENDENT_AMBULATORY_CARE_PROVIDER_SITE_OTHER): Payer: Medicaid Other | Admitting: Adult Health

## 2013-05-22 ENCOUNTER — Ambulatory Visit (INDEPENDENT_AMBULATORY_CARE_PROVIDER_SITE_OTHER): Payer: Medicaid Other | Admitting: Adult Health

## 2013-05-22 ENCOUNTER — Encounter: Payer: Self-pay | Admitting: Adult Health

## 2013-05-22 VITALS — BP 130/90 | Ht 63.0 in | Wt 200.0 lb

## 2013-05-22 DIAGNOSIS — O039 Complete or unspecified spontaneous abortion without complication: Secondary | ICD-10-CM

## 2013-05-22 NOTE — Progress Notes (Signed)
Subjective:     Patient ID: Kathryn Vasquez, female   DOB: 03-26-1991, 22 y.o.   MRN: [id number]  HPI Kathryn Vasquez is in sp miscarriage, after taking Cytotec.still bleeding light and some cramps like menstrual cramps.Blood type O+.  Review of Systems See HPI Reviewed past medical,surgical, social and family history. Reviewed medications and allergies.     Objective:   Physical Exam BP 130/90  Ht 5\' 3"  (1.6 m)  Wt 200 lb (90.719 kg)  BMI 35.44 kg/m2  LMP 02/04/2013 Skin warm and dry.Pelvic: external genitalia is normal in appearance, vagina: brown discharge without odor, cervix:smooth and bulbous,negative CMT. uterus: normal size, shape and contour, tender, no masses felt, adnexa: no masses or tenderness noted.     Assessment:     Miscarriage    Plan:     Check QHCG Follow up labs in am No sex yet

## 2013-05-22 NOTE — Patient Instructions (Addendum)
No sex yet Follow up in 1 week Call in am for labs Miscarriage A miscarriage is the sudden loss of an unborn baby (fetus) before the 20th week of pregnancy. Most miscarriages happen in the first 3 months of pregnancy. Sometimes, it happens before a woman even knows she is pregnant. A miscarriage is also called a "spontaneous miscarriage" or "early pregnancy loss." Having a miscarriage can be an emotional experience. Talk with your caregiver about any questions you may have about miscarrying, the grieving process, and your future pregnancy plans. CAUSES   Problems with the fetal chromosomes that make it impossible for the baby to develop normally. Problems with the baby's genes or chromosomes are most often the result of errors that occur, by chance, as the embryo divides and grows. The problems are not inherited from the parents.  Infection of the cervix or uterus.   Hormone problems.   Problems with the cervix, such as having an incompetent cervix. This is when the tissue in the cervix is not strong enough to hold the pregnancy.   Problems with the uterus, such as an abnormally shaped uterus, uterine fibroids, or congenital abnormalities.   Certain medical conditions.   Smoking, drinking alcohol, or taking illegal drugs.   Trauma.  Often, the cause of a miscarriage is unknown.  SYMPTOMS   Vaginal bleeding or spotting, with or without cramps or pain.  Pain or cramping in the abdomen or lower back.  Passing fluid, tissue, or blood clots from the vagina. DIAGNOSIS  Your caregiver will perform a physical exam. You may also have an ultrasound to confirm the miscarriage. Blood or urine tests may also be ordered. TREATMENT   Sometimes, treatment is not necessary if you naturally pass all the fetal tissue that was in the uterus. If some of the fetus or placenta remains in the body (incomplete miscarriage), tissue left behind may become infected and must be removed. Usually, a  dilation and curettage (D and C) procedure is performed. During a D and C procedure, the cervix is widened (dilated) and any remaining fetal or placental tissue is gently removed from the uterus.  Antibiotic medicines are prescribed if there is an infection. Other medicines may be given to reduce the size of the uterus (contract) if there is a lot of bleeding.  If you have Rh negative blood and your baby was Rh positive, you will need a Rh immunoglobulin shot. This shot will protect any future baby from having Rh blood problems in future pregnancies. HOME CARE INSTRUCTIONS   Your caregiver may order bed rest or may allow you to continue light activity. Resume activity as directed by your caregiver.  Have someone help with home and family responsibilities during this time.   Keep track of the number of sanitary pads you use each day and how soaked (saturated) they are. Write down this information.   Do not use tampons. Do not douche or have sexual intercourse until approved by your caregiver.   Only take over-the-counter or prescription medicines for pain or discomfort as directed by your caregiver.   Do not take aspirin. Aspirin can cause bleeding.   Keep all follow-up appointments with your caregiver.   If you or your partner have problems with grieving, talk to your caregiver or seek counseling to help cope with the pregnancy loss. Allow enough time to grieve before trying to get pregnant again.  SEEK IMMEDIATE MEDICAL CARE IF:   You have severe cramps or pain in your back or  abdomen.  You have a fever.  You pass large blood clots (walnut-sized or larger) ortissue from your vagina. Save any tissue for your caregiver to inspect.   Your bleeding increases.   You have a thick, bad-smelling vaginal discharge.  You become lightheaded, weak, or you faint.   You have chills.  MAKE SURE YOU:  Understand these instructions.  Will watch your condition.  Will get help  right away if you are not doing well or get worse. Document Released: 02/23/2001 Document Revised: 02/29/2012 Document Reviewed: 10/19/2011 [facility] Patient Information 2014 [place], [place].

## 2013-05-23 ENCOUNTER — Telehealth: Payer: Self-pay | Admitting: Adult Health

## 2013-05-23 NOTE — Telephone Encounter (Signed)
Left message that [facility] 12.4

## 2013-05-24 NOTE — Progress Notes (Signed)
This encounter was created in error - please disregard.

## 2013-05-29 ENCOUNTER — Encounter: Payer: Medicaid Other | Admitting: Women's Health

## 2013-05-29 ENCOUNTER — Ambulatory Visit (INDEPENDENT_AMBULATORY_CARE_PROVIDER_SITE_OTHER): Payer: Medicaid Other | Admitting: Women's Health

## 2013-05-29 ENCOUNTER — Encounter: Payer: Self-pay | Admitting: Women's Health

## 2013-05-29 VITALS — BP 140/84 | Ht 63.0 in | Wt 198.6 lb

## 2013-05-29 DIAGNOSIS — O039 Complete or unspecified spontaneous abortion without complication: Secondary | ICD-10-CM

## 2013-05-29 NOTE — Progress Notes (Signed)
Patient ID: Kathryn Vasquez, female   DOB: 12/21/1990, 22 y.o.   MRN: [id number] Kathryn Vasquez is a 23 y.o. G18P1011 female who is s/p complete SAB, here for f/u. Had round of cytotec and passed fetal tissue ~2wks ago. bHCG 12.4 on 05/22/13.  States bleeding stopped yesterday, and is no longer having cramps.  Pregnancy is desired.    O: BP 140/84  Ht 5\' 3"  (1.6 m)  Wt 198 lb 9.6 oz (90.084 kg)  BMI 35.19 kg/m2  LMP 02/04/2013  Breastfeeding? Unknown   A: s/p SAB     Desires pregnancy soon      P: Recommended to wait at least 3 months inbetween SAB and trying to conceive again     To continue PNV, no etoh/drugs/tobacco      Return prn  Cheral Marker, CNM, [facility] 05/29/2013 12:14 PM

## 2013-05-29 NOTE — Patient Instructions (Signed)
Preparing for Pregnancy Preparing for pregnancy (preconceptual care) by getting counseling and information from your caregiver before getting pregnant is a good idea. It will help you and your baby have a better chance to have a healthy, safe pregnancy and delivery of your baby. Make an appointment with your caregiver to talk about your health, medical, and family history and how to prepare yourself before getting pregnant. Your caregiver will do a complete physical exam and a Pap test. They will want to know:  About you, your spouse or partner, and your family's medical and genetic history.  If you are eating a balanced diet and drinking enough fluids.  What vitamins and mineral supplements you are taking. This includes taking folic acid before getting pregnant to help prevent birth defects.  What medications you are taking including prescription, over-the-counter and herbal medications.  If there is any substance abuse like alcohol, smoking, and illegal drugs.  If there is any mental or physical domestic violence.  If there is any risk of sexually transmitted disease between you and your partner.  What immunizations and vaccinations you have had and what you may need before getting pregnant.  If you should get tested for HIV infection.  If there is any exposure to chemical or toxic substances at home or work.  If there are medical problems you have that need to be treated and kept under control before getting pregnant such as diabetes, high blood pressure or others.  If there were any past surgeries, pregnancies and problems with them.  What your current weight is and to set a goal as to how much weight you should gain while pregnant. Also, they will check if you should lose or gain weight before getting pregnant.  What is your exercise routine and what it is safe when you are pregnant.  If there are any physical disabilities that need to be addressed.  About spacing your  pregnancies when there are other children.  If there is a financial problem that may affect you having a child. After talking about the above points with your caregiver, your caregiver will give you advice on how to help treat and work with you on solving any issues, if necessary, before getting pregnant. The goal is to have a healthy and safe pregnancy for you and your baby. You should keep an accurate record of your menstrual periods because it will help in determining your due date. Immunizations that you should have before getting pregnant:   Regular measles, German measles (rubella) and mumps.  Tetanus and diphtheria.  Chickenpox, if not immune.  Herpes zoster (Varicella) if not immune.  Human papilloma virus vaccine (HPV) between the age of 9 and 26 years old.  Hepatitis A vaccine.  Hepatitis B vaccine.  Influenza vaccine.  Pneumococcal vaccine (pneumonia). You should avoid getting pregnant for one month after getting vaccinated with a live virus vaccine such as German measles (rubella) vaccine. Other immunizations may be necessary depending on where you live, such as malaria. Ask your caregiver if any other immunizations are needed for you. HOME CARE INSTRUCTIONS   Follow the advice of your caregiver.  Before getting pregnant:  Begin taking vitamins, supplements, and 0.4 milligrams folic acid daily.  Get your immunizations up-to-date.  Get help from a nutrition counselor if you do not understand what a balanced diet is, need help with a special medical diet or if you need help to lose or gain weight.  Begin exercising.  Stop smoking, taking illegal drugs,   and drinking alcoholic beverages.  Get counseling if there is and type of domestic violence.  Get checked for sexually transmitted diseases including HIV.  Get any medical problems under control (diabetes, high blood pressure, convulsions, asthma or others).  Resolve any financial concerns.  Be sure you and  your spouse or partner are ready to have a baby.  Keep an accurate record of your menstrual periods. Document Released: 08/12/2008 Document Revised: 11/22/2011 Document Reviewed: 08/12/2008 [employer] Patient Information 2014 [employer].  

## 2013-06-04 NOTE — Progress Notes (Signed)
This encounter was created in error - please disregard.

## 2013-11-07 ENCOUNTER — Ambulatory Visit (INDEPENDENT_AMBULATORY_CARE_PROVIDER_SITE_OTHER): Payer: Medicaid Other

## 2013-11-07 ENCOUNTER — Other Ambulatory Visit: Payer: Self-pay | Admitting: Obstetrics & Gynecology

## 2013-11-07 ENCOUNTER — Encounter: Payer: Self-pay | Admitting: Radiology

## 2013-11-07 ENCOUNTER — Encounter (INDEPENDENT_AMBULATORY_CARE_PROVIDER_SITE_OTHER): Payer: Self-pay

## 2013-11-07 DIAGNOSIS — O09299 Supervision of pregnancy with other poor reproductive or obstetric history, unspecified trimester: Secondary | ICD-10-CM

## 2013-11-07 DIAGNOSIS — O3680X0 Pregnancy with inconclusive fetal viability, not applicable or unspecified: Secondary | ICD-10-CM

## 2013-11-07 NOTE — Progress Notes (Signed)
U/S-transvaginal u/s performed, single IU gestational sac noted with +YS noted within, GS meas c/w 5+5wks, cx appears closed, bilateral adnexa appears wnl with C.L. Noted on Lt, no free fluid noted within pelvis

## 2013-11-12 ENCOUNTER — Ambulatory Visit (INDEPENDENT_AMBULATORY_CARE_PROVIDER_SITE_OTHER): Payer: Medicaid Other | Admitting: Adult Health

## 2013-11-12 ENCOUNTER — Encounter: Payer: Self-pay | Admitting: Adult Health

## 2013-11-12 VITALS — BP 110/74 | Wt 217.0 lb

## 2013-11-12 DIAGNOSIS — O200 Threatened abortion: Secondary | ICD-10-CM

## 2013-11-12 DIAGNOSIS — O09299 Supervision of pregnancy with other poor reproductive or obstetric history, unspecified trimester: Secondary | ICD-10-CM

## 2013-11-12 DIAGNOSIS — Z1389 Encounter for screening for other disorder: Secondary | ICD-10-CM

## 2013-11-12 DIAGNOSIS — O209 Hemorrhage in early pregnancy, unspecified: Secondary | ICD-10-CM

## 2013-11-12 DIAGNOSIS — Z3490 Encounter for supervision of normal pregnancy, unspecified, unspecified trimester: Secondary | ICD-10-CM

## 2013-11-12 DIAGNOSIS — Z331 Pregnant state, incidental: Secondary | ICD-10-CM

## 2013-11-12 HISTORY — DX: Encounter for supervision of normal pregnancy, unspecified, unspecified trimester: Z34.90

## 2013-11-12 HISTORY — DX: Hemorrhage in early pregnancy, unspecified: O20.9

## 2013-11-12 LAB — POCT URINALYSIS DIPSTICK
GLUCOSE UA: NEGATIVE
Ketones, UA: NEGATIVE
Leukocytes, UA: NEGATIVE
NITRITE UA: NEGATIVE
PROTEIN UA: NEGATIVE
RBC UA: 2

## 2013-11-12 NOTE — Patient Instructions (Signed)
Vaginal Bleeding During Pregnancy, First Trimester A small amount of bleeding (spotting) from the vagina is relatively common in early pregnancy. It usually stops on its own. Various things may cause bleeding or spotting in early pregnancy. Some bleeding may be related to the pregnancy, and some may not. In most cases, the bleeding is normal and is not a problem. However, bleeding can also be a sign of something serious. Be sure to tell your health care provider about any vaginal bleeding right away. Some possible causes of vaginal bleeding during the first trimester include:  Infection or inflammation of the cervix.  Growths (polyps) on the cervix.  Miscarriage or threatened miscarriage.  Pregnancy tissue has developed outside of the uterus and in a fallopian tube (tubal pregnancy).  Tiny cysts have developed in the uterus instead of pregnancy tissue (molar pregnancy). HOME CARE INSTRUCTIONS  Watch your condition for any changes. The following actions may help to lessen any discomfort you are feeling:  Follow your health care provider's instructions for limiting your activity. If your health care provider orders bed rest, you may need to stay in bed and only get up to use the bathroom. However, your health care provider may allow you to continue light activity.  If needed, make plans for someone to help with your regular activities and responsibilities while you are on bed rest.  Keep track of the number of pads you use each day, how often you change pads, and how soaked (saturated) they are. Write this down.  Do not use tampons. Do not douche.  Do not have sexual intercourse or orgasms until approved by your health care provider.  If you pass any tissue from your vagina, save the tissue so you can show it to your health care provider.  Only take over-the-counter or prescription medicines as directed by your health care provider.  Do not take aspirin because it can make you  bleed.  Keep all follow-up appointments as directed by your health care provider. SEEK MEDICAL CARE IF:  You have any vaginal bleeding during any part of your pregnancy.  You have cramps or labor pains. SEEK IMMEDIATE MEDICAL CARE IF:   You have severe cramps in your back or belly (abdomen).  You have a fever, not controlled by medicine.  You pass large clots or tissue from your vagina.  Your bleeding increases.  You feel lightheaded or weak, or you have fainting episodes.  You have chills.  You are leaking fluid or have a gush of fluid from your vagina.  You pass out while having a bowel movement. MAKE SURE YOU:  Understand these instructions.  Will watch your condition.  Will get help right away if you are not doing well or get worse. Document Released: 06/09/2005 Document Revised: 06/20/2013 Document Reviewed: 05/07/2013 [facility] Patient Information 2014 [place], [place]. Push fluids  No sex Return on Friday for US as scheduled

## 2013-11-12 NOTE — Progress Notes (Signed)
Pt worked in today for bright pink spotting, and cramping. Cramping started yesterday and spotting started today. No sex in 2 days and BM early this morning.

## 2013-11-12 NOTE — Progress Notes (Signed)
Had bright pink bleeding this am, spotted til about 1 hour ago,had some cramps, on exam no bleeding noted, US showed +YS +fetal pole and FCA at 107 on US. Increase fluids, no sex keep appt Friday.

## 2013-11-15 ENCOUNTER — Other Ambulatory Visit: Payer: Self-pay | Admitting: Obstetrics & Gynecology

## 2013-11-15 DIAGNOSIS — O3680X0 Pregnancy with inconclusive fetal viability, not applicable or unspecified: Secondary | ICD-10-CM

## 2013-11-16 ENCOUNTER — Other Ambulatory Visit: Payer: Self-pay | Admitting: Obstetrics & Gynecology

## 2013-11-16 ENCOUNTER — Ambulatory Visit (INDEPENDENT_AMBULATORY_CARE_PROVIDER_SITE_OTHER): Payer: Medicaid Other

## 2013-11-16 DIAGNOSIS — O3680X0 Pregnancy with inconclusive fetal viability, not applicable or unspecified: Secondary | ICD-10-CM

## 2013-11-16 DIAGNOSIS — O09299 Supervision of pregnancy with other poor reproductive or obstetric history, unspecified trimester: Secondary | ICD-10-CM

## 2013-11-16 NOTE — Progress Notes (Signed)
U/S(7+0wks)-single IUP with +FCA notedm, FHR-130 bpm, cx appears closed, bilateral adnexa wnl, CRL c/w previous u/s measurements

## 2013-11-26 ENCOUNTER — Ambulatory Visit (INDEPENDENT_AMBULATORY_CARE_PROVIDER_SITE_OTHER): Payer: Medicaid Other | Admitting: Adult Health

## 2013-11-26 ENCOUNTER — Encounter: Payer: Self-pay | Admitting: Adult Health

## 2013-11-26 VITALS — BP 120/80 | Wt 212.0 lb

## 2013-11-26 DIAGNOSIS — Z3480 Encounter for supervision of other normal pregnancy, unspecified trimester: Secondary | ICD-10-CM

## 2013-11-26 DIAGNOSIS — O219 Vomiting of pregnancy, unspecified: Secondary | ICD-10-CM | POA: Insufficient documentation

## 2013-11-26 DIAGNOSIS — O09299 Supervision of pregnancy with other poor reproductive or obstetric history, unspecified trimester: Secondary | ICD-10-CM

## 2013-11-26 DIAGNOSIS — Z1389 Encounter for screening for other disorder: Secondary | ICD-10-CM

## 2013-11-26 DIAGNOSIS — Z331 Pregnant state, incidental: Secondary | ICD-10-CM

## 2013-11-26 DIAGNOSIS — Z3490 Encounter for supervision of normal pregnancy, unspecified, unspecified trimester: Secondary | ICD-10-CM

## 2013-11-26 DIAGNOSIS — Z98891 History of uterine scar from previous surgery: Secondary | ICD-10-CM | POA: Insufficient documentation

## 2013-11-26 HISTORY — DX: Encounter for supervision of normal pregnancy, unspecified, unspecified trimester: Z34.90

## 2013-11-26 HISTORY — DX: History of uterine scar from previous surgery: Z98.891

## 2013-11-26 HISTORY — DX: Vomiting of pregnancy, unspecified: O21.9

## 2013-11-26 LAB — CBC
HCT: 35.8 % — ABNORMAL LOW (ref 36.0–46.0)
Hemoglobin: 12.4 g/dL (ref 12.0–15.0)
MCH: 28.6 pg (ref 26.0–34.0)
MCHC: 34.6 g/dL (ref 30.0–36.0)
MCV: 82.7 fL (ref 78.0–100.0)
PLATELETS: 320 10*3/uL (ref 150–400)
RBC: 4.33 MIL/uL (ref 3.87–5.11)
RDW: 13.5 % (ref 11.5–15.5)
WBC: 6.5 10*3/uL (ref 4.0–10.5)

## 2013-11-26 MED ORDER — DOXYLAMINE-PYRIDOXINE 10-10 MG PO TBEC: DELAYED_RELEASE_TABLET | ORAL | Status: DC

## 2013-11-26 NOTE — Progress Notes (Signed)
Pt states that she has had some cramping but denies any bleeding since about 2-3 weeks ago. Pt given CCNC form and lab consent to read over and sign. Pt states that she is having terrible nausea and vomiting, not currently taking nausea medication.

## 2013-11-26 NOTE — Progress Notes (Signed)
Subjective:  Kathryn Vasquez is a 23 y.o. 533P1011 Caucasian female at 71335w3d by US being seen today for her first obstetrical visit.  Her obstetrical history is significant for Had C section .  Pregnancy history fully reviewed.  Patient reports nausea and vomiting, will try diclegis. Denies vb, cramping, uti s/s, abnormal/malodorous vag d/c, or vulvovaginal itching/irritation.  BP 120/80  Wt 212 lb (96.163 kg)  LMP 02/04/2013  HISTORY: OB History  Gravida Para Term Preterm AB SAB TAB Ectopic Multiple Living  3 1 1  0 1 1 0 0 0 1    # Outcome Date GA Lbr Len/2nd Weight Sex Delivery Anes PTL Lv  3 CUR           2 TRM 2010 6577w0d  8 lb 1 oz (3.657 kg) M LTCS   Y  1 SAB              Past Medical History  Diagnosis Date  . Migraine   . Depression   . Anxiety   . First trimester bleeding 04/16/2013  . Pregnancy   . Pregnant 11/12/2013  . Vaginal bleeding in pregnant patient at less than [redacted] weeks gestation 11/12/2013  . Supervision of normal pregnancy 11/26/2013   Past Surgical History  Procedure Laterality Date  . Cesarean section     Family History  Problem Relation Age of Onset  . Cancer Maternal Grandmother     breast  . Hypertension Mother     Exam   System:     General: Well developed & nourished, no acute distress   Skin: Warm & dry, normal coloration and turgor, no rashes   Neurologic: Alert & oriented, normal mood   Cardiovascular: Regular rate & rhythm   Respiratory: Effort & rate normal, LCTAB, acyanotic   Abdomen: Soft, non tender   Extremities: normal strength, tone   Pelvic Exam:    Perineum: deferred   Vulva: deferred   Vagina:  deferred   Cervix: deferred   Uterus: Normal size/shape/contour for GA   Request pap from Dr Reuel Boomaniel FHR: 171 via US   Assessment:   Pregnancy: G3P1011 Patient Active Problem List   Diagnosis Date Noted  . Supervision of normal pregnancy 11/26/2013  . Pregnant 11/12/2013  . Vaginal bleeding in pregnant patient at less than [redacted]  weeks gestation 11/12/2013  . Missed abortion 05/08/2013    44335w3d G3P1011 New OB visit     Plan:  Initial labs drawn Continue prenatal vitamins Problem list reviewed and updated Reviewed n/v relief measures and warning s/s to report Reviewed recommended weight gain based on pre-gravid BMI Encouraged well-balanced diet Genetic Screening discussed Integrated Screen: requested Cystic fibrosis screening discussed requested Ultrasound discussed; fetal survey: requested Follow up in 4 weeks for IT/NT and see Harvel RicksKim  Nathanyal Ashmead A. Wrenna Saks, NP 11/26/2013 11:55 AM

## 2013-11-26 NOTE — Patient Instructions (Signed)
Pregnancy - First Trimester During sexual intercourse, millions of sperm go into the vagina. Only 1 sperm will penetrate and fertilize the female egg while it is in the Fallopian tube. One week later, the fertilized egg implants into the wall of the uterus. An embryo begins to develop into a baby. At 6 to 8 weeks, the eyes and face are formed and the heartbeat can be seen on ultrasound. At the end of 12 weeks (first trimester), all the baby's organs are formed. Now that you are pregnant, you will want to do everything you can to have a healthy baby. Two of the most important things are to get good prenatal care and follow your caregiver's instructions. Prenatal care is all the medical care you receive before the baby's birth. It is given to prevent, find, and treat problems during the pregnancy and childbirth. PRENATAL EXAMS During prenatal visits, your weight, blood pressure, and urine are checMorning Sickness Morning sickness is when you feel sick to your stomach (nauseous) during pregnancy. This nauseous feeling may or may not come with vomiting. It often occurs in the morning but can be a problem any time of day. Morning sickness is most common during the first trimester, but it may continue throughout pregnancy. While morning sickness is unpleasant, it is usually harmless unless you develop severe and continual vomiting (hyperemesis gravidarum). This condition requires more intense treatment.  CAUSES  The cause of morning sickness is not completely known but seems to be related to normal hormonal changes that occur in pregnancy. RISK FACTORS You are at greater risk if you: Experienced nausea or vomiting before your pregnancy. Had morning sickness during a previous pregnancy. Are pregnant with more than one baby, such as twins. TREATMENT  Do not use any medicines (prescription, over-the-counter, or herbal) for morning sickness without first talking to your health care provider. Your health care  provider may prescribe or recommend: Vitamin B6 supplements. Anti-nausea medicines. The herbal medicine ginger. HOME CARE INSTRUCTIONS  Only take over-the-counter or prescription medicines as directed by your health care provider. Taking multivitamins before getting pregnant can prevent or decrease the severity of morning sickness in most women.  Eat a piece of dry toast or unsalted crackers before getting out of bed in the morning.  Eat five or six small meals a day.  Eat dry and bland foods (rice, baked potato). Foods high in carbohydrates are often helpful. Do not drink liquids with your meals. Drink liquids between meals.  Avoid greasy, fatty, and spicy foods.  Get someone to cook for you if the smell of any food causes nausea and vomiting.  If you feel nauseous after taking prenatal vitamins, take the vitamins at night or with a snack. Snack on protein foods (nuts, yogurt, cheese) between meals if you are hungry.  Eat unsweetened gelatins for desserts.  Wearing an acupressure wristband (worn for sea sickness) may be helpful.  Acupuncture may be helpful.  Do not smoke.  Get a humidifier to keep the air in your house free of odors.  Get plenty of fresh air. SEEK MEDICAL CARE IF:  Your home remedies are not working, and you need medicine. You feel dizzy or lightheaded. You are losing weight. SEEK IMMEDIATE MEDICAL CARE IF:  You have persistent and uncontrolled nausea and vomiting. You pass out (faint). Document Released: 10/21/2006 Document Revised: 05/02/2013 Document Reviewed: 02/14/2013 [facility] Patient Information 2014 [place], [place].  ed. This is done to make sure you are healthy and progressing normally during the pregnancy.  A pregnant woman should gain 25 to 35 pounds during the pregnancy. However, if you are overweight or underweight, your caregiver will advise you regarding your weight.  Your caregiver will ask and answer questions for you.  Blood  work, cervical cultures, other necessary tests, and a Pap test are done during your prenatal exams. These tests are done to check on your health and the probable health of your baby. Tests are strongly recommended and done for HIV with your permission. This is the virus that causes AIDS. These tests are done because medicines can be given to help prevent your baby from being born with this infection should you have been infected without knowing it. Blood work is also used to find out your blood type, previous infections, and follow your blood levels (hemoglobin).  Low hemoglobin (anemia) is common during pregnancy. Iron and vitamins are given to help prevent this. Later in the pregnancy, blood tests for diabetes will be done along with any other tests if any problems develop.  You may need other tests to make sure you and the baby are doing well. CHANGES DURING THE FIRST TRIMESTER  Your body goes through many changes during pregnancy. They vary from person to person. Talk to your caregiver about changes you notice and are concerned about. Changes can include:  Your menstrual period stops.  The egg and sperm carry the genes that determine what you look like. Genes from you and your partner are forming a baby. The female genes determine whether the baby is a boy or a girl.  Your body increases in girth and you may feel bloated.  Feeling sick to your stomach (nauseous) and throwing up (vomiting). If the vomiting is uncontrollable, call your caregiver.  Your breasts will begin to enlarge and become tender.  Your nipples may stick out more and become darker.  The need to urinate more. Painful urination may mean you have a bladder infection.  Tiring easily.  Loss of appetite.  Cravings for certain kinds of food.  At first, you may gain or lose a couple of pounds.  You may have changes in your emotions from day to day (excited to be pregnant or concerned something may go wrong with the pregnancy  and baby).  You may have more vivid and strange dreams. HOME CARE INSTRUCTIONS   It is very important to avoid all smoking, alcohol and non-prescribed drugs during your pregnancy. These affect the formation and growth of the baby. Avoid chemicals while pregnant to ensure the delivery of a healthy infant.  Start your prenatal visits by the 12th week of pregnancy. They are usually scheduled monthly at first, then more often in the last 2 months before delivery. Keep your caregiver's appointments. Follow your caregiver's instructions regarding medicine use, blood and lab tests, exercise, and diet.  During pregnancy, you are providing food for you and your baby. Eat regular, well-balanced meals. Choose foods such as meat, fish, milk and other low fat dairy products, vegetables, fruits, and whole-grain breads and cereals. Your caregiver will tell you of the ideal weight gain.  You can help morning sickness by keeping soda crackers at the bedside. Eat a couple before arising in the morning. You may want to use the crackers without salt on them.  Eating 4 to 5 small meals rather than 3 large meals a day also may help the nausea and vomiting.  Drinking liquids between meals instead of during meals also seems to help nausea and vomiting.  A physical  sexual relationship may be continued throughout pregnancy if there are no other problems. Problems may be early (premature) leaking of amniotic fluid from the membranes, vaginal bleeding, or belly (abdominal) pain.  Exercise regularly if there are no restrictions. Check with your caregiver or physical therapist if you are unsure of the safety of some of your exercises. Greater weight gain will occur in the last 2 trimesters of pregnancy. Exercising will help:  Control your weight.  Keep you in shape.  Prepare you for labor and delivery.  Help you lose your pregnancy weight after you deliver your baby.  Wear a good support or jogging bra for breast  tenderness during pregnancy. This may help if worn during sleep too.  Ask when prenatal classes are available. Begin classes when they are offered.  Do not use hot tubs, steam rooms, or saunas.  Wear your seat belt when driving. This protects you and your baby if you are in an accident.  Avoid raw meat, uncooked cheese, cat litter boxes, and soil used by cats throughout the pregnancy. These carry germs that can cause birth defects in the baby.  The first trimester is a good time to visit your dentist for your dental health. Getting your teeth cleaned is okay. Use a softer toothbrush and brush gently during pregnancy.  Ask for help if you have financial, counseling, or nutritional needs during pregnancy. Your caregiver will be able to offer counseling for these needs as well as refer you for other special needs.  Do not take any medicines or herbs unless told by your caregiver.  Inform your caregiver if there is any mental or physical domestic violence.  Make a list of emergency phone numbers of family, friends, hospital, and police and fire departments.  Write down your questions. Take them to your prenatal visit.  Do not douche.  Do not cross your legs.  If you have to stand for long periods of time, rotate you feet or take small steps in a circle.  You may have more vaginal secretions that may require a sanitary pad. Do not use tampons or scented sanitary pads. MEDICINES AND DRUG USE IN PREGNANCY  Take prenatal vitamins as directed. The vitamin should contain 1 milligram of folic acid. Keep all vitamins out of reach of children. Only a couple vitamins or tablets containing iron may be fatal to a baby or young child when ingested.  Avoid use of all medicines, including herbs, over-the-counter medicines, not prescribed or suggested by your caregiver. Only take over-the-counter or prescription medicines for pain, discomfort, or fever as directed by your caregiver. Do not use aspirin,  ibuprofen, or naproxen unless directed by your caregiver.  Let your caregiver also know about herbs you may be using.  Alcohol is related to a number of birth defects. This includes fetal alcohol syndrome. All alcohol, in any form, should be avoided completely. Smoking will cause low birth rate and premature babies.  Street or illegal drugs are very harmful to the baby. They are absolutely forbidden. A baby born to an addicted mother will be addicted at birth. The baby will go through the same withdrawal an adult does.  Let your caregiver know about any medicines that you have to take and for what reason you take them. SEEK MEDICAL CARE IF:  You have any concerns or worries during your pregnancy. It is better to call with your questions if you feel they cannot wait, rather than worry about them. SEEK IMMEDIATE MEDICAL CARE IF:  An unexplained oral temperature above 102 F (38.9 C) develops, or as your caregiver suggests.  You have leaking of fluid from the vagina (birth canal). If leaking membranes are suspected, take your temperature and inform your caregiver of this when you call.  There is vaginal spotting or bleeding. Notify your caregiver of the amount and how many pads are used.  You develop a bad smelling vaginal discharge with a change in the color.  You continue to feel sick to your stomach (nauseated) and have no relief from remedies suggested. You vomit blood or coffee ground-like materials.  You lose more than 2 pounds of weight in 1 week.  You gain more than 2 pounds of weight in 1 week and you notice swelling of your face, hands, feet, or legs.  You gain 5 pounds or more in 1 week (even if you do not have swelling of your hands, face, legs, or feet).  You get exposed to [place] measles and have never had them.  You are exposed to fifth disease or chickenpox.  You develop belly (abdominal) pain. Round ligament discomfort is a common non-cancerous (benign) cause of  abdominal pain in pregnancy. Your caregiver still must evaluate this.  You develop headache, fever, diarrhea, pain with urination, or shortness of breath.  You fall or are in a car accident or have any kind of trauma.  There is mental or physical violence in your home. Document Released: 08/24/2001 Document Revised: 05/24/2012 Document Reviewed: 02/25/2009 [facility] Patient Information 2014 [place], [place]. Try iclegis and return in 4 weeks for IT/NT

## 2013-11-27 ENCOUNTER — Encounter: Payer: Self-pay | Admitting: Adult Health

## 2013-11-27 LAB — ABO AND RH: Rh Type: POSITIVE

## 2013-11-27 LAB — HEPATITIS B SURFACE ANTIGEN: Hepatitis B Surface Ag: NEGATIVE

## 2013-11-27 LAB — TSH: TSH: 1.524 u[IU]/mL (ref 0.350–4.500)

## 2013-11-27 LAB — GC/CHLAMYDIA PROBE AMP
CT PROBE, AMP APTIMA: NEGATIVE
GC PROBE AMP APTIMA: NEGATIVE

## 2013-11-27 LAB — ANTIBODY SCREEN: Antibody Screen: NEGATIVE

## 2013-11-27 LAB — RPR

## 2013-11-27 LAB — VARICELLA ZOSTER ANTIBODY, IGG: Varicella IgG: 2765 Index — ABNORMAL HIGH (ref <135.00)

## 2013-11-27 LAB — HIV ANTIBODY (ROUTINE TESTING W REFLEX): HIV: NONREACTIVE

## 2013-11-27 LAB — RUBELLA SCREEN: RUBELLA: 3.68 {index} — AB (ref <0.90)

## 2013-11-28 LAB — POCT URINALYSIS DIPSTICK
GLUCOSE UA: NEGATIVE
Protein, UA: NEGATIVE

## 2013-11-28 LAB — CYSTIC FIBROSIS DIAGNOSTIC STUDY: Result Summary:: DETECTED — AB

## 2013-11-29 ENCOUNTER — Encounter: Payer: Self-pay | Admitting: Adult Health

## 2013-11-29 ENCOUNTER — Telehealth: Payer: Self-pay | Admitting: Adult Health

## 2013-11-29 NOTE — Telephone Encounter (Signed)
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

## 2013-12-10 ENCOUNTER — Encounter: Payer: Self-pay | Admitting: Adult Health

## 2013-12-10 NOTE — Progress Notes (Unsigned)
Patient ID: Kathryn DeedJosie Mae Vasquez, female   DOB: 01-09-1991, 23 y.o.   MRN: [id number] PT CALLED, STATED SHE IS [redacted] WEEKS PREGNANT, HAVING "SEVERE" PAINS.  I ASKED PT IF SHE WAS SPOTTING SHE STATED SHE DIDN'T KNOW.  I SPOKE WITH ASHLEY TRAVIS, LPN AND SHE STATED [facility]AV[facility]ERE [facility].  THE [facility] WAS T[facility] AND [facility].   YJeannie Fend

## 2013-12-20 ENCOUNTER — Other Ambulatory Visit: Payer: Self-pay | Admitting: Adult Health

## 2013-12-20 DIAGNOSIS — Z36 Encounter for antenatal screening of mother: Secondary | ICD-10-CM

## 2013-12-24 ENCOUNTER — Other Ambulatory Visit: Payer: Medicaid Other

## 2013-12-24 ENCOUNTER — Encounter: Payer: Medicaid Other | Admitting: Women's Health

## 2014-01-01 ENCOUNTER — Ambulatory Visit (INDEPENDENT_AMBULATORY_CARE_PROVIDER_SITE_OTHER): Payer: Medicaid Other

## 2014-01-01 ENCOUNTER — Ambulatory Visit (INDEPENDENT_AMBULATORY_CARE_PROVIDER_SITE_OTHER): Payer: Medicaid Other | Admitting: Women's Health

## 2014-01-01 ENCOUNTER — Encounter: Payer: Self-pay | Admitting: Women's Health

## 2014-01-01 VITALS — BP 120/60 | Wt 207.6 lb

## 2014-01-01 DIAGNOSIS — O09899 Supervision of other high risk pregnancies, unspecified trimester: Secondary | ICD-10-CM

## 2014-01-01 DIAGNOSIS — Z3480 Encounter for supervision of other normal pregnancy, unspecified trimester: Secondary | ICD-10-CM

## 2014-01-01 DIAGNOSIS — O09299 Supervision of pregnancy with other poor reproductive or obstetric history, unspecified trimester: Secondary | ICD-10-CM

## 2014-01-01 DIAGNOSIS — R350 Frequency of micturition: Secondary | ICD-10-CM

## 2014-01-01 DIAGNOSIS — O34219 Maternal care for unspecified type scar from previous cesarean delivery: Secondary | ICD-10-CM

## 2014-01-01 DIAGNOSIS — Z331 Pregnant state, incidental: Secondary | ICD-10-CM

## 2014-01-01 DIAGNOSIS — O2390 Unspecified genitourinary tract infection in pregnancy, unspecified trimester: Secondary | ICD-10-CM

## 2014-01-01 DIAGNOSIS — O26899 Other specified pregnancy related conditions, unspecified trimester: Secondary | ICD-10-CM

## 2014-01-01 DIAGNOSIS — R109 Unspecified abdominal pain: Secondary | ICD-10-CM

## 2014-01-01 DIAGNOSIS — Z3490 Encounter for supervision of normal pregnancy, unspecified, unspecified trimester: Secondary | ICD-10-CM

## 2014-01-01 DIAGNOSIS — Z36 Encounter for antenatal screening of mother: Secondary | ICD-10-CM

## 2014-01-01 DIAGNOSIS — N898 Other specified noninflammatory disorders of vagina: Secondary | ICD-10-CM

## 2014-01-01 DIAGNOSIS — Z1389 Encounter for screening for other disorder: Secondary | ICD-10-CM

## 2014-01-01 DIAGNOSIS — Z98891 History of uterine scar from previous surgery: Secondary | ICD-10-CM

## 2014-01-01 DIAGNOSIS — Z141 Cystic fibrosis carrier: Secondary | ICD-10-CM

## 2014-01-01 LAB — POCT WET PREP (WET MOUNT): CLUE CELLS WET PREP WHIFF POC: POSITIVE

## 2014-01-01 LAB — POCT URINALYSIS DIPSTICK
Blood, UA: NEGATIVE
Glucose, UA: NEGATIVE
Ketones, UA: NEGATIVE
LEUKOCYTES UA: NEGATIVE
Nitrite, UA: NEGATIVE
PROTEIN UA: NEGATIVE

## 2014-01-01 MED ORDER — METRONIDAZOLE 500 MG PO TABS: 500.0000 mg | ORAL_TABLET | Freq: Two times a day (BID) | ORAL | Status: DC

## 2014-01-01 NOTE — Addendum Note (Signed)
Addended by: Shawna ClampBOOKER, KIMBERLY R on: 01/01/2014 11:55 AM   Modules accepted: Orders

## 2014-01-01 NOTE — Progress Notes (Addendum)
Denies lof, vb. +urinary frequency/urgency- will send cx.  Cramping, increased d/c.  Spec exam: very tender- states she is always like this w/ exams- not w/ sex though, small amt slightly malodorous white d/c, cx visually closed. Deferred sve, cx L&C on u/s, and pt would be unable to tolerate. Wet prep +clues, rx flagyl. Increase fluids. [facility], some generalized aches/pains since. Has lost 12lb since beginning of pregnancy, states she is eating well- no vomiting. H/O c/s for FTD after couple hrs pushing, 8lb1oz, states he was OP. Discussed vbac, consent given to take home and review. 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 Reviewed warning s/s to report.  All questions answered. F/U in 4wks for 2nd IT and visit.

## 2014-01-01 NOTE — Progress Notes (Signed)
Clear vaginal discharge with no odor.  

## 2014-01-01 NOTE — Progress Notes (Signed)
U/S(13+4wks)-active fetus,  CRL c/w dates, cx appears closed (3.4cm), bilateral adnexa appears wnl, posterior Gr 0 placenta noted, NB present, NT- 1.1008mm, FHR-168 bpm

## 2014-01-01 NOTE — Patient Instructions (Signed)
Pregnancy - First Trimester °During sexual intercourse, millions of sperm go into the vagina. Only 1 sperm will penetrate and fertilize the female egg while it is in the Fallopian tube. One week later, the fertilized egg implants into the wall of the uterus. An embryo begins to develop into a baby. At 6 to 8 weeks, the eyes and face are formed and the heartbeat can be seen on ultrasound. At the end of 12 weeks (first trimester), all the baby's organs are formed. Now that you are pregnant, you will want to do everything you can to have a healthy baby. Two of the most important things are to get good prenatal care and follow your caregiver's instructions. Prenatal care is all the medical care you receive before the baby's birth. It is given to prevent, find, and treat problems during the pregnancy and childbirth. °PRENATAL EXAMS °· During prenatal visits, your weight, blood pressure, and urine are checked. This is done to make sure you are healthy and progressing normally during the pregnancy. °· A pregnant woman should gain 25 to 35 pounds during the pregnancy. However, if you are overweight or underweight, your caregiver will advise you regarding your weight. °· Your caregiver will ask and answer questions for you. °· Blood work, cervical cultures, other necessary tests, and a Pap test are done during your prenatal exams. These tests are done to check on your health and the probable health of your baby. Tests are strongly recommended and done for HIV with your permission. This is the virus that causes AIDS. These tests are done because medicines can be given to help prevent your baby from being born with this infection should you have been infected without knowing it. Blood work is also used to find out your blood type, previous infections, and follow your blood levels (hemoglobin). °· Low hemoglobin (anemia) is common during pregnancy. Iron and vitamins are given to help prevent this. Later in the pregnancy, blood  tests for diabetes will be done along with any other tests if any problems develop. °· You may need other tests to make sure you and the baby are doing well. °CHANGES DURING THE FIRST TRIMESTER  °Your body goes through many changes during pregnancy. They vary from person to person. Talk to your caregiver about changes you notice and are concerned about. Changes can include: °· Your menstrual period stops. °· The egg and sperm carry the genes that determine what you look like. Genes from you and your partner are forming a baby. The female genes determine whether the baby is a boy or a girl. °· Your body increases in girth and you may feel bloated. °· Feeling sick to your stomach (nauseous) and throwing up (vomiting). If the vomiting is uncontrollable, call your caregiver. °· Your breasts will begin to enlarge and become tender. °· Your nipples may stick out more and become darker. °· The need to urinate more. Painful urination may mean you have a bladder infection. °· Tiring easily. °· Loss of appetite. °· Cravings for certain kinds of food. °· At first, you may gain or lose a couple of pounds. °· You may have changes in your emotions from day to day (excited to be pregnant or concerned something may go wrong with the pregnancy and baby). °· You may have more vivid and strange dreams. °HOME CARE INSTRUCTIONS  °· It is very important to avoid all smoking, alcohol and non-prescribed drugs during your pregnancy. These affect the formation and growth of the baby.   Avoid chemicals while pregnant to ensure the delivery of a healthy infant. °· Start your prenatal visits by the 12th week of pregnancy. They are usually scheduled monthly at first, then more often in the last 2 months before delivery. Keep your caregiver's appointments. Follow your caregiver's instructions regarding medicine use, blood and lab tests, exercise, and diet. °· During pregnancy, you are providing food for you and your baby. Eat regular, well-balanced  meals. Choose foods such as meat, fish, milk and other low fat dairy products, vegetables, fruits, and whole-grain breads and cereals. Your caregiver will tell you of the ideal weight gain. °· You can help morning sickness by keeping soda crackers at the bedside. Eat a couple before arising in the morning. You may want to use the crackers without salt on them. °· Eating 4 to 5 small meals rather than 3 large meals a day also may help the nausea and vomiting. °· Drinking liquids between meals instead of during meals also seems to help nausea and vomiting. °· A physical sexual relationship may be continued throughout pregnancy if there are no other problems. Problems may be early (premature) leaking of amniotic fluid from the membranes, vaginal bleeding, or belly (abdominal) pain. °· Exercise regularly if there are no restrictions. Check with your caregiver or physical therapist if you are unsure of the safety of some of your exercises. Greater weight gain will occur in the last 2 trimesters of pregnancy. Exercising will help: °· Control your weight. °· Keep you in shape. °· Prepare you for labor and delivery. °· Help you lose your pregnancy weight after you deliver your baby. °· Wear a good support or jogging bra for breast tenderness during pregnancy. This may help if worn during sleep too. °· Ask when prenatal classes are available. Begin classes when they are offered. °· Do not use hot tubs, steam rooms, or saunas. °· Wear your seat belt when driving. This protects you and your baby if you are in an accident. °· Avoid raw meat, uncooked cheese, cat litter boxes, and soil used by cats throughout the pregnancy. These carry germs that can cause birth defects in the baby. °· The first trimester is a good time to visit your dentist for your dental health. Getting your teeth cleaned is okay. Use a softer toothbrush and brush gently during pregnancy. °· Ask for help if you have financial, counseling, or nutritional needs  during pregnancy. Your caregiver will be able to offer counseling for these needs as well as refer you for other special needs. °· Do not take any medicines or herbs unless told by your caregiver. °· Inform your caregiver if there is any mental or physical domestic violence. °· Make a list of emergency phone numbers of family, friends, hospital, and police and fire departments. °· Write down your questions. Take them to your prenatal visit. °· Do not douche. °· Do not cross your legs. °· If you have to stand for long periods of time, rotate you feet or take small steps in a circle. °· You may have more vaginal secretions that may require a sanitary pad. Do not use tampons or scented sanitary pads. °MEDICINES AND DRUG USE IN PREGNANCY °· Take prenatal vitamins as directed. The vitamin should contain 1 milligram of folic acid. Keep all vitamins out of reach of children. Only a couple vitamins or tablets containing iron may be fatal to a baby or young child when ingested. °· Avoid use of all medicines, including herbs, over-the-counter medicines, not   prescribed or suggested by your caregiver. Only take over-the-counter or prescription medicines for pain, discomfort, or fever as directed by your caregiver. Do not use aspirin, ibuprofen, or naproxen unless directed by your caregiver. °· Let your caregiver also know about herbs you may be using. °· Alcohol is related to a number of birth defects. This includes fetal alcohol syndrome. All alcohol, in any form, should be avoided completely. Smoking will cause low birth rate and premature babies. °· Street or illegal drugs are very harmful to the baby. They are absolutely forbidden. A baby born to an addicted mother will be addicted at birth. The baby will go through the same withdrawal an adult does. °· Let your caregiver know about any medicines that you have to take and for what reason you take them. °SEEK MEDICAL CARE IF:  °You have any concerns or worries during your  pregnancy. It is better to call with your questions if you feel they cannot wait, rather than worry about them. °SEEK IMMEDIATE MEDICAL CARE IF:  °· An unexplained oral temperature above 102° F (38.9° C) develops, or as your caregiver suggests. °· You have leaking of fluid from the vagina (birth canal). If leaking membranes are suspected, take your temperature and inform your caregiver of this when you call. °· There is vaginal spotting or bleeding. Notify your caregiver of the amount and how many pads are used. °· You develop a bad smelling vaginal discharge with a change in the color. °· You continue to feel sick to your stomach (nauseated) and have no relief from remedies suggested. You vomit blood or coffee ground-like materials. °· You lose more than 2 pounds of weight in 1 week. °· You gain more than 2 pounds of weight in 1 week and you notice swelling of your face, hands, feet, or legs. °· You gain 5 pounds or more in 1 week (even if you do not have swelling of your hands, face, legs, or feet). °· You get exposed to German measles and have never had them. °· You are exposed to fifth disease or chickenpox. °· You develop belly (abdominal) pain. Round ligament discomfort is a common non-cancerous (benign) cause of abdominal pain in pregnancy. Your caregiver still must evaluate this. °· You develop headache, fever, diarrhea, pain with urination, or shortness of breath. °· You fall or are in a car accident or have any kind of trauma. °· There is mental or physical violence in your home. °Document Released: 08/24/2001 Document Revised: 05/24/2012 Document Reviewed: 02/25/2009 °ExitCare® Patient Information ©2014 [employer]. °Bacterial Vaginosis °Bacterial vaginosis is a vaginal infection that occurs when the normal balance of bacteria in the vagina is disrupted. It results from an overgrowth of certain bacteria. This is the most common vaginal infection in women of childbearing age. Treatment is important to  prevent complications, especially in pregnant women, as it can cause a premature delivery. °CAUSES  °Bacterial vaginosis is caused by an increase in harmful bacteria that are normally present in smaller amounts in the vagina. Several different kinds of bacteria can cause bacterial vaginosis. However, the reason that the condition develops is not fully understood. °RISK FACTORS °Certain activities or behaviors can put you at an increased risk of developing bacterial vaginosis, including: °· Having a new sex partner or multiple sex partners. °· Douching. °· Using an intrauterine device (IUD) for contraception. °Women do not get bacterial vaginosis from toilet seats, bedding, swimming pools, or contact with objects around them. °SIGNS AND SYMPTOMS  °Some women   with bacterial vaginosis have no signs or symptoms. Common symptoms include: °· Grey vaginal discharge. °· A fishlike odor with discharge, especially after sexual intercourse. °· Itching or burning of the vagina and vulva. °· Burning or pain with urination. °DIAGNOSIS  °Your health care provider will take a medical history and examine the vagina for signs of bacterial vaginosis. A sample of vaginal fluid may be taken. Your health care provider will look at this sample under a microscope to check for bacteria and abnormal cells. A vaginal pH test may also be done.  °TREATMENT  °Bacterial vaginosis may be treated with antibiotic medicines. These may be given in the form of a pill or a vaginal cream. A second round of antibiotics may be prescribed if the condition comes back after treatment.  °HOME CARE INSTRUCTIONS  °· Only take over-the-counter or prescription medicines as directed by your health care provider. °· If antibiotic medicine was prescribed, take it as directed. Make sure you finish it even if you start to feel better. °· Do not have sex until treatment is completed. °· Tell all sexual partners that you have a vaginal infection. They should see their  health care provider and be treated if they have problems, such as a mild rash or itching. °· Practice safe sex by using condoms and only having one sex partner. °SEEK MEDICAL CARE IF:  °· Your symptoms are not improving after 3 days of treatment. °· You have increased discharge or pain. °· You have a fever. °MAKE SURE YOU:  °· Understand these instructions. °· Will watch your condition. °· Will get help right away if you are not doing well or get worse. °FOR MORE INFORMATION  °Centers for Disease Control and Prevention, Division of STD Prevention: www.cdc.gov/std °American Sexual Health Association (ASHA): www.ashastd.org  °Document Released: 08/30/2005 Document Revised: 06/20/2013 Document Reviewed: 04/11/2013 °ExitCare® Patient Information ©2014 [employer]. ° °

## 2014-01-02 LAB — GC/CHLAMYDIA PROBE AMP
CT Probe RNA: NEGATIVE
GC PROBE AMP APTIMA: NEGATIVE

## 2014-01-02 LAB — URINE CULTURE
COLONY COUNT: NO GROWTH
ORGANISM ID, BACTERIA: NO GROWTH

## 2014-01-07 ENCOUNTER — Encounter: Payer: Self-pay | Admitting: *Deleted

## 2014-01-07 LAB — MATERNAL SCREEN, INTEGRATED #1

## 2014-01-14 ENCOUNTER — Ambulatory Visit (INDEPENDENT_AMBULATORY_CARE_PROVIDER_SITE_OTHER): Payer: Medicaid Other | Admitting: Women's Health

## 2014-01-14 ENCOUNTER — Telehealth: Payer: Self-pay | Admitting: *Deleted

## 2014-01-14 ENCOUNTER — Encounter: Payer: Self-pay | Admitting: Women's Health

## 2014-01-14 VITALS — BP 132/72 | Wt 205.5 lb

## 2014-01-14 DIAGNOSIS — Z1389 Encounter for screening for other disorder: Secondary | ICD-10-CM

## 2014-01-14 DIAGNOSIS — O2390 Unspecified genitourinary tract infection in pregnancy, unspecified trimester: Secondary | ICD-10-CM

## 2014-01-14 DIAGNOSIS — Z331 Pregnant state, incidental: Secondary | ICD-10-CM

## 2014-01-14 DIAGNOSIS — R82998 Other abnormal findings in urine: Secondary | ICD-10-CM

## 2014-01-14 DIAGNOSIS — O34219 Maternal care for unspecified type scar from previous cesarean delivery: Secondary | ICD-10-CM

## 2014-01-14 DIAGNOSIS — Z3480 Encounter for supervision of other normal pregnancy, unspecified trimester: Secondary | ICD-10-CM

## 2014-01-14 DIAGNOSIS — O09299 Supervision of pregnancy with other poor reproductive or obstetric history, unspecified trimester: Secondary | ICD-10-CM

## 2014-01-14 DIAGNOSIS — O9989 Other specified diseases and conditions complicating pregnancy, childbirth and the puerperium: Secondary | ICD-10-CM

## 2014-01-14 LAB — POCT URINALYSIS DIPSTICK
Glucose, UA: NEGATIVE
Ketones, UA: NEGATIVE
Nitrite, UA: NEGATIVE
Protein, UA: NEGATIVE
RBC UA: NEGATIVE

## 2014-01-14 MED ORDER — NYSTATIN-TRIAMCINOLONE 100000-0.1 UNIT/GM-% EX OINT: 1.0000 "application " | TOPICAL_OINTMENT | Freq: Two times a day (BID) | CUTANEOUS | Status: DC | PRN

## 2014-01-14 NOTE — Progress Notes (Signed)
Work-in for report of cramps that feel like BH x few days, worsening. Denies lof, vb, uti s/s, abnormal/malodorous vag d/c.  Still on flagyl for +BV, should have been finished w/ rx, but states she forgets to take it bid. States d/c has greatly improved.  +Vulvar irritation and itching around clitoral hood, labia minora. Spec exam: cx visually closed, creamy white nonodorous d/c, SVE: LTC. Will try mytrex. Neg gc/ct 4/21, mutually monogamous relationship. Recommended increasing po water intake, resting on sides. Concerned b/c she is continuing to lose weight, reports eating 3 meals/day, and eating like normal. Reassured. Reviewed warning s/s to report.  All questions answered. F/U 5/19 as scheduled for visit and 2nd IT.

## 2014-01-14 NOTE — Telephone Encounter (Signed)
Pt called office back states has an appt today at 3:30, pt to push fluids, rest on right side and take tylenol, If better will call office this afternoon and cancel appt.

## 2014-01-14 NOTE — Telephone Encounter (Signed)
Pt states for 20-30 minutes thinks having Braxton Hicks, dull pain abdominal and back area. No vaginal bleeding. Place phone call on hold to discuss pt complaints with Joellyn HaffKim Booker, CNM, line disconnected, attempted to call pt back and left message x 1.

## 2014-01-14 NOTE — Patient Instructions (Signed)
Increase water intake enough so that your urine is clear If you get really cramping, drink large glass of water and lay on one of your sides  Second Trimester of Pregnancy The second trimester is from week 13 through week 28, months 4 through 6. The second trimester is often a time when you feel your best. Your body has also adjusted to being pregnant, and you begin to feel better physically. Usually, morning sickness has lessened or quit completely, you may have more energy, and you may have an increase in appetite. The second trimester is also a time when the fetus is growing rapidly. At the end of the sixth month, the fetus is about 9 inches long and weighs about 1 pounds. You will likely begin to feel the baby move (quickening) between 18 and 20 weeks of the pregnancy. BODY CHANGES Your body goes through many changes during pregnancy. The changes vary from woman to woman.   Your weight will continue to increase. You will notice your lower abdomen bulging out.  You may begin to get stretch marks on your hips, abdomen, and breasts.  You may develop headaches that can be relieved by medicines approved by your caregiver.  You may urinate more often because the fetus is pressing on your bladder.  You may develop or continue to have heartburn as a result of your pregnancy.  You may develop constipation because certain hormones are causing the muscles that push waste through your intestines to slow down.  You may develop hemorrhoids or swollen, bulging veins (varicose veins).  You may have back pain because of the weight gain and pregnancy hormones relaxing your joints between the bones in your pelvis and as a result of a shift in weight and the muscles that support your balance.  Your breasts will continue to grow and be tender.  Your gums may bleed and may be sensitive to brushing and flossing.  Dark spots or blotches (chloasma, mask of pregnancy) may develop on your face. This will  likely fade after the baby is born.  A dark line from your belly button to the pubic area (linea nigra) may appear. This will likely fade after the baby is born. WHAT TO EXPECT AT YOUR PRENATAL VISITS During a routine prenatal visit:  You will be weighed to make sure you and the fetus are growing normally.  Your blood pressure will be taken.  Your abdomen will be measured to track your baby's growth.  The fetal heartbeat will be listened to.  Any test results from the previous visit will be discussed. Your caregiver may ask you:  How you are feeling.  If you are feeling the baby move.  If you have had any abnormal symptoms, such as leaking fluid, bleeding, severe headaches, or abdominal cramping.  If you have any questions. Other tests that may be performed during your second trimester include:  Blood tests that check for:  Low iron levels (anemia).  Gestational diabetes (between 24 and 28 weeks).  Rh antibodies.  Urine tests to check for infections, diabetes, or protein in the urine.  An ultrasound to confirm the proper growth and development of the baby.  An amniocentesis to check for possible genetic problems.  Fetal screens for spina bifida and Down syndrome. HOME CARE INSTRUCTIONS   Avoid all smoking, herbs, alcohol, and unprescribed drugs. These chemicals affect the formation and growth of the baby.  Follow your caregiver's instructions regarding medicine use. There are medicines that are either safe or  unsafe to take during pregnancy.  Exercise only as directed by your caregiver. Experiencing uterine cramps is a good sign to stop exercising.  Continue to eat regular, healthy meals.  Wear a good support bra for breast tenderness.  Do not use hot tubs, steam rooms, or saunas.  Wear your seat belt at all times when driving.  Avoid raw meat, uncooked cheese, cat litter boxes, and soil used by cats. These carry germs that can cause birth defects in the  baby.  Take your prenatal vitamins.  Try taking a stool softener (if your caregiver approves) if you develop constipation. Eat more high-fiber foods, such as fresh vegetables or fruit and whole grains. Drink plenty of fluids to keep your urine clear or pale yellow.  Take warm sitz baths to soothe any pain or discomfort caused by hemorrhoids. Use hemorrhoid cream if your caregiver approves.  If you develop varicose veins, wear support hose. Elevate your feet for 15 minutes, 3 4 times a day. Limit salt in your diet.  Avoid heavy lifting, wear low heel shoes, and practice good posture.  Rest with your legs elevated if you have leg cramps or low back pain.  Visit your dentist if you have not gone yet during your pregnancy. Use a soft toothbrush to brush your teeth and be gentle when you floss.  A sexual relationship may be continued unless your caregiver directs you otherwise.  Continue to go to all your prenatal visits as directed by your caregiver. SEEK MEDICAL CARE IF:   You have dizziness.  You have mild pelvic cramps, pelvic pressure, or nagging pain in the abdominal area.  You have persistent nausea, vomiting, or diarrhea.  You have a bad smelling vaginal discharge.  You have pain with urination. SEEK IMMEDIATE MEDICAL CARE IF:   You have a fever.  You are leaking fluid from your vagina.  You have spotting or bleeding from your vagina.  You have severe abdominal cramping or pain.  You have rapid weight gain or loss.  You have shortness of breath with chest pain.  You notice sudden or extreme swelling of your face, hands, ankles, feet, or legs.  You have not felt your baby move in over an hour.  You have severe headaches that do not go away with medicine.  You have vision changes. Document Released: 08/24/2001 Document Revised: 05/02/2013 Document Reviewed: 10/31/2012 [facility] Patient Information 2014 [place].

## 2014-01-16 LAB — URINE CULTURE
Colony Count: NO GROWTH
ORGANISM ID, BACTERIA: NO GROWTH

## 2014-01-29 ENCOUNTER — Ambulatory Visit (INDEPENDENT_AMBULATORY_CARE_PROVIDER_SITE_OTHER): Payer: Self-pay | Admitting: Women's Health

## 2014-01-29 ENCOUNTER — Encounter: Payer: Self-pay | Admitting: Women's Health

## 2014-01-29 VITALS — BP 128/60 | Wt 207.0 lb

## 2014-01-29 DIAGNOSIS — Z3480 Encounter for supervision of other normal pregnancy, unspecified trimester: Secondary | ICD-10-CM

## 2014-01-29 DIAGNOSIS — Z331 Pregnant state, incidental: Secondary | ICD-10-CM

## 2014-01-29 DIAGNOSIS — Z1389 Encounter for screening for other disorder: Secondary | ICD-10-CM

## 2014-01-29 LAB — POCT URINALYSIS DIPSTICK
Blood, UA: NEGATIVE
GLUCOSE UA: NEGATIVE
Ketones, UA: NEGATIVE
Leukocytes, UA: NEGATIVE
Nitrite, UA: NEGATIVE
Protein, UA: NEGATIVE

## 2014-01-29 NOTE — Progress Notes (Signed)
Reports no fm x 3wks where she had been feeling it since 13wks, [facility] +[facility]. Denies lof, vb, uti s/s.  Still w/ cramps all day every day despite po hydration.  Spec exam: cx visually closed, normal nonodorous d/c, SVE: LTC.  Reassured, to continue w/ po hydration, can try pelvic rest to see if it helps. Some sob/cp- feels like it is stress related, to cut out all caffeine, perform stress-relieving activities- if still present next visit will consider Holter. Reviewed ptl s/s, fm.  All questions answered. F/U in 3wks for anatomy u/s and visit.  2nd IT today.

## 2014-01-29 NOTE — Patient Instructions (Signed)
Second Trimester of Pregnancy The second trimester is from week 13 through week 28, months 4 through 6. The second trimester is often a time when you feel your best. Your body has also adjusted to being pregnant, and you begin to feel better physically. Usually, morning sickness has lessened or quit completely, you may have more energy, and you may have an increase in appetite. The second trimester is also a time when the fetus is growing rapidly. At the end of the sixth month, the fetus is about 9 inches long and weighs about 1 pounds. You will likely begin to feel the baby move (quickening) between 18 and 20 weeks of the pregnancy. BODY CHANGES Your body goes through many changes during pregnancy. The changes vary from woman to woman.   Your weight will continue to increase. You will notice your lower abdomen bulging out.  You may begin to get stretch marks on your hips, abdomen, and breasts.  You may develop headaches that can be relieved by medicines approved by your caregiver.  You may urinate more often because the fetus is pressing on your bladder.  You may develop or continue to have heartburn as a result of your pregnancy.  You may develop constipation because certain hormones are causing the muscles that push waste through your intestines to slow down.  You may develop hemorrhoids or swollen, bulging veins (varicose veins).  You may have back pain because of the weight gain and pregnancy hormones relaxing your joints between the bones in your pelvis and as a result of a shift in weight and the muscles that support your balance.  Your breasts will continue to grow and be tender.  Your gums may bleed and may be sensitive to brushing and flossing.  Dark spots or blotches (chloasma, mask of pregnancy) may develop on your face. This will likely fade after the baby is born.  A dark line from your belly button to the pubic area (linea nigra) may appear. This will likely fade after the  baby is born. WHAT TO EXPECT AT YOUR PRENATAL VISITS During a routine prenatal visit:  You will be weighed to make sure you and the fetus are growing normally.  Your blood pressure will be taken.  Your abdomen will be measured to track your baby's growth.  The fetal heartbeat will be listened to.  Any test results from the previous visit will be discussed. Your caregiver may ask you:  How you are feeling.  If you are feeling the baby move.  If you have had any abnormal symptoms, such as leaking fluid, bleeding, severe headaches, or abdominal cramping.  If you have any questions. Other tests that may be performed during your second trimester include:  Blood tests that check for:  Low iron levels (anemia).  Gestational diabetes (between 24 and 28 weeks).  Rh antibodies.  Urine tests to check for infections, diabetes, or protein in the urine.  An ultrasound to confirm the proper growth and development of the baby.  An amniocentesis to check for possible genetic problems.  Fetal screens for spina bifida and Down syndrome. HOME CARE INSTRUCTIONS   Avoid all smoking, herbs, alcohol, and unprescribed drugs. These chemicals affect the formation and growth of the baby.  Follow your caregiver's instructions regarding medicine use. There are medicines that are either safe or unsafe to take during pregnancy.  Exercise only as directed by your caregiver. Experiencing uterine cramps is a good sign to stop exercising.  Continue to eat regular,   healthy meals.  Wear a good support bra for breast tenderness.  Do not use hot tubs, steam rooms, or saunas.  Wear your seat belt at all times when driving.  Avoid raw meat, uncooked cheese, cat litter boxes, and soil used by cats. These carry germs that can cause birth defects in the baby.  Take your prenatal vitamins.  Try taking a stool softener (if your caregiver approves) if you develop constipation. Eat more high-fiber foods,  such as fresh vegetables or fruit and whole grains. Drink plenty of fluids to keep your urine clear or pale yellow.  Take warm sitz baths to soothe any pain or discomfort caused by hemorrhoids. Use hemorrhoid cream if your caregiver approves.  If you develop varicose veins, wear support hose. Elevate your feet for 15 minutes, 3 4 times a day. Limit salt in your diet.  Avoid heavy lifting, wear low heel shoes, and practice good posture.  Rest with your legs elevated if you have leg cramps or low back pain.  Visit your dentist if you have not gone yet during your pregnancy. Use a soft toothbrush to brush your teeth and be gentle when you floss.  A sexual relationship may be continued unless your caregiver directs you otherwise.  Continue to go to all your prenatal visits as directed by your caregiver. SEEK MEDICAL CARE IF:   You have dizziness.  You have mild pelvic cramps, pelvic pressure, or nagging pain in the abdominal area.  You have persistent nausea, vomiting, or diarrhea.  You have a bad smelling vaginal discharge.  You have pain with urination. SEEK IMMEDIATE MEDICAL CARE IF:   You have a fever.  You are leaking fluid from your vagina.  You have spotting or bleeding from your vagina.  You have severe abdominal cramping or pain.  You have rapid weight gain or loss.  You have shortness of breath with chest pain.  You notice sudden or extreme swelling of your face, hands, ankles, feet, or legs.  You have not felt your baby move in over an hour.  You have severe headaches that do not go away with medicine.  You have vision changes. Document Released: 08/24/2001 Document Revised: 05/02/2013 Document Reviewed: 10/31/2012 [employer] Patient Information 2014 [employer].  

## 2014-02-01 LAB — MATERNAL SCREEN, INTEGRATED #2
AFP MoM: 0.91
AFP, Serum: 27.7 ng/mL
Calculated Gestational Age: 17
Crown Rump Length: 69.8 mm
ESTRIOL FREE MAT SCREEN: 0.89 ng/mL
Estriol Mom: 0.94
HCG, MOM MAT SCREEN: 1.36
Inhibin A Dimeric: 131 pg/mL
Inhibin A MoM: 0.92
MSS Down Syndrome: <1:5000 {titer}
MSS Trisomy 18 Risk: <1:5000 {titer}
NT MoM: 1.16
Nuchal Translucency: 1.8 mm
Number of fetuses: 1
PAPP-A MoM: 1.49
PAPP-A: 1124 ng/mL
hCG, Serum: 32.6 IU/mL

## 2014-02-05 ENCOUNTER — Encounter: Payer: Self-pay | Admitting: Women's Health

## 2014-02-19 ENCOUNTER — Ambulatory Visit (INDEPENDENT_AMBULATORY_CARE_PROVIDER_SITE_OTHER): Payer: Self-pay | Admitting: Women's Health

## 2014-02-19 ENCOUNTER — Encounter: Payer: Self-pay | Admitting: Women's Health

## 2014-02-19 ENCOUNTER — Ambulatory Visit (INDEPENDENT_AMBULATORY_CARE_PROVIDER_SITE_OTHER): Payer: Medicaid Other

## 2014-02-19 ENCOUNTER — Other Ambulatory Visit: Payer: Self-pay | Admitting: Women's Health

## 2014-02-19 VITALS — BP 124/76 | Wt 206.0 lb

## 2014-02-19 DIAGNOSIS — Z1389 Encounter for screening for other disorder: Secondary | ICD-10-CM

## 2014-02-19 DIAGNOSIS — Z3480 Encounter for supervision of other normal pregnancy, unspecified trimester: Secondary | ICD-10-CM

## 2014-02-19 DIAGNOSIS — Z3490 Encounter for supervision of normal pregnancy, unspecified, unspecified trimester: Secondary | ICD-10-CM

## 2014-02-19 DIAGNOSIS — O09299 Supervision of pregnancy with other poor reproductive or obstetric history, unspecified trimester: Secondary | ICD-10-CM

## 2014-02-19 DIAGNOSIS — Z331 Pregnant state, incidental: Secondary | ICD-10-CM

## 2014-02-19 LAB — POCT URINALYSIS DIPSTICK
Blood, UA: NEGATIVE
GLUCOSE UA: NEGATIVE
KETONES UA: NEGATIVE
LEUKOCYTES UA: NEGATIVE
Nitrite, UA: NEGATIVE
Protein, UA: NEGATIVE

## 2014-02-19 NOTE — Progress Notes (Signed)
U/S(20+1wks)-active fetus, meas c/w dates, fluid wnl, posterior Gr 0 placenta, cx appears closed, bilateral adnexa appears wnl, FHR-152 bpm, female fetus, no major abnl noted

## 2014-02-19 NOTE — Patient Instructions (Signed)
Second Trimester of Pregnancy The second trimester is from week 13 through week 28, months 4 through 6. The second trimester is often a time when you feel your best. Your body has also adjusted to being pregnant, and you begin to feel better physically. Usually, morning sickness has lessened or quit completely, you may have more energy, and you may have an increase in appetite. The second trimester is also a time when the fetus is growing rapidly. At the end of the sixth month, the fetus is about 9 inches long and weighs about 1 pounds. You will likely begin to feel the baby move (quickening) between 18 and 20 weeks of the pregnancy. BODY CHANGES Your body goes through many changes during pregnancy. The changes vary from woman to woman.   Your weight will continue to increase. You will notice your lower abdomen bulging out.  You may begin to get stretch marks on your hips, abdomen, and breasts.  You may develop headaches that can be relieved by medicines approved by your caregiver.  You may urinate more often because the fetus is pressing on your bladder.  You may develop or continue to have heartburn as a result of your pregnancy.  You may develop constipation because certain hormones are causing the muscles that push waste through your intestines to slow down.  You may develop hemorrhoids or swollen, bulging veins (varicose veins).  You may have back pain because of the weight gain and pregnancy hormones relaxing your joints between the bones in your pelvis and as a result of a shift in weight and the muscles that support your balance.  Your breasts will continue to grow and be tender.  Your gums may bleed and may be sensitive to brushing and flossing.  Dark spots or blotches (chloasma, mask of pregnancy) may develop on your face. This will likely fade after the baby is born.  A dark line from your belly button to the pubic area (linea nigra) may appear. This will likely fade after the  baby is born. WHAT TO EXPECT AT YOUR PRENATAL VISITS During a routine prenatal visit:  You will be weighed to make sure you and the fetus are growing normally.  Your blood pressure will be taken.  Your abdomen will be measured to track your baby's growth.  The fetal heartbeat will be listened to.  Any test results from the previous visit will be discussed. Your caregiver may ask you:  How you are feeling.  If you are feeling the baby move.  If you have had any abnormal symptoms, such as leaking fluid, bleeding, severe headaches, or abdominal cramping.  If you have any questions. Other tests that may be performed during your second trimester include:  Blood tests that check for:  Low iron levels (anemia).  Gestational diabetes (between 24 and 28 weeks).  Rh antibodies.  Urine tests to check for infections, diabetes, or protein in the urine.  An ultrasound to confirm the proper growth and development of the baby.  An amniocentesis to check for possible genetic problems.  Fetal screens for spina bifida and Down syndrome. HOME CARE INSTRUCTIONS   Avoid all smoking, herbs, alcohol, and unprescribed drugs. These chemicals affect the formation and growth of the baby.  Follow your caregiver's instructions regarding medicine use. There are medicines that are either safe or unsafe to take during pregnancy.  Exercise only as directed by your caregiver. Experiencing uterine cramps is a good sign to stop exercising.  Continue to eat regular,   healthy meals.  Wear a good support bra for breast tenderness.  Do not use hot tubs, steam rooms, or saunas.  Wear your seat belt at all times when driving.  Avoid raw meat, uncooked cheese, cat litter boxes, and soil used by cats. These carry germs that can cause birth defects in the baby.  Take your prenatal vitamins.  Try taking a stool softener (if your caregiver approves) if you develop constipation. Eat more high-fiber foods,  such as fresh vegetables or fruit and whole grains. Drink plenty of fluids to keep your urine clear or pale yellow.  Take warm sitz baths to soothe any pain or discomfort caused by hemorrhoids. Use hemorrhoid cream if your caregiver approves.  If you develop varicose veins, wear support hose. Elevate your feet for 15 minutes, 3 4 times a day. Limit salt in your diet.  Avoid heavy lifting, wear low heel shoes, and practice good posture.  Rest with your legs elevated if you have leg cramps or low back pain.  Visit your dentist if you have not gone yet during your pregnancy. Use a soft toothbrush to brush your teeth and be gentle when you floss.  A sexual relationship may be continued unless your caregiver directs you otherwise.  Continue to go to all your prenatal visits as directed by your caregiver. SEEK MEDICAL CARE IF:   You have dizziness.  You have mild pelvic cramps, pelvic pressure, or nagging pain in the abdominal area.  You have persistent nausea, vomiting, or diarrhea.  You have a bad smelling vaginal discharge.  You have pain with urination. SEEK IMMEDIATE MEDICAL CARE IF:   You have a fever.  You are leaking fluid from your vagina.  You have spotting or bleeding from your vagina.  You have severe abdominal cramping or pain.  You have rapid weight gain or loss.  You have shortness of breath with chest pain.  You notice sudden or extreme swelling of your face, hands, ankles, feet, or legs.  You have not felt your baby move in over an hour.  You have severe headaches that do not go away with medicine.  You have vision changes. Document Released: 08/24/2001 Document Revised: 05/02/2013 Document Reviewed: 10/31/2012 [facility] Patient Information 2014 [place], [place].  Panic Attacks Panic attacks are sudden, short-livedsurges of severe anxiety, fear, or discomfort. They may occur for no reason when you are relaxed, when you are anxious, or when you are  sleeping. Panic attacks may occur for a number of reasons:   Healthy people occasionally have panic attacks in extreme, life-threatening situations, such as war or natural disasters. Normal anxiety is a protective mechanism of the body that helps us react to danger (fight or flight response).  Panic attacks are often seen with anxiety disorders, such as panic disorder, social anxiety disorder, generalized anxiety disorder, and phobias. Anxiety disorders cause excessive or uncontrollable anxiety. They may interfere with your relationships or other life activities.  Panic attacks are sometimes seen with other mental illnesses such as depression and posttraumatic stress disorder.  Certain medical conditions, prescription medicines, and drugs of abuse can cause panic attacks. SYMPTOMS  Panic attacks start suddenly, peak within 20 minutes, and are accompanied by four or more of the following symptoms:  Pounding heart or fast heart rate (palpitations).  Sweating.  Trembling or shaking.  Shortness of breath or feeling smothered.  Feeling choked.  Chest pain or discomfort.  Nausea or strange feeling in your stomach.  Dizziness, lightheadedness, or feeling like you  will faint.  Chills or hot flushes.  Numbness or tingling in your lips or hands and feet.  Feeling that things are not real or feeling that you are not yourself.  Fear of losing control or going crazy.  Fear of dying. Some of these symptoms can mimic serious medical conditions. For example, you may think you are having a heart attack. Although panic attacks can be very scary, they are not life threatening. DIAGNOSIS  Panic attacks are diagnosed through an assessment by your health care provider. Your health care provider will ask questions about your symptoms, such as where and when they occurred. Your health care provider will also ask about your medical history and use of alcohol and drugs, including prescription medicines.  Your health care provider may order blood tests or other studies to rule out a serious medical condition. Your health care provider may refer you to a mental health professional for further evaluation. TREATMENT   Most healthy people who have one or two panic attacks in an extreme, life-threatening situation will not require treatment.  The treatment for panic attacks associated with anxiety disorders or other mental illness typically involves counseling with a mental health professional, medicine, or a combination of both. Your health care provider will help determine what treatment is best for you.  Panic attacks due to physical illness usually goes away with treatment of the illness. If prescription medicine is causing panic attacks, talk with your health care provider about stopping the medicine, decreasing the dose, or substituting another medicine.  Panic attacks due to alcohol or drug abuse goes away with abstinence. Some adults need professional help in order to stop drinking or using drugs. HOME CARE INSTRUCTIONS   Take all your medicines as prescribed.   Check with your health care provider before starting new prescription or over-the-counter medicines.  Keep all follow up appointments with your health care provider. SEEK MEDICAL CARE IF:  You are not able to take your medicines as prescribed.  Your symptoms do not improve or get worse. SEEK IMMEDIATE MEDICAL CARE IF:   You experience panic attack symptoms that are different than your usual symptoms.  You have serious thoughts about hurting yourself or others.  You are taking medicine for panic attacks and have a serious side effect. MAKE SURE YOU:  Understand these instructions.  Will watch your condition.  Will get help right away if you are not doing well or get worse. Document Released: 08/30/2005 Document Revised: 06/20/2013 Document Reviewed: 04/13/2013 [facility] Patient Information 2014 [place], [place].

## 2014-02-19 NOTE — Progress Notes (Signed)
Low-risk OB appointment [id number] [redacted]w[redacted]d Estimated Date of Delivery: 07/08/14 Blood pressure 124/76, weight 206 lb (93.441 kg), last menstrual period 02/04/2013.  BP, weight, and urine results all reviewed and noted.  Please refer to the obstetrical flow sheet for the fundal height and fetal heart rate documentation. Reports good fm.  Denies regular uc's, lof, vb, or uti s/s. Still having occ episodes of heart racing, sob- does report that fingers tingle- has h/o panic attacks, states this feels like the ones she used to have. Still having cramping- not any worse- cx is long & closed on today's u/s.  Reviewed today's anatomy u/s, ptl s/s, fm. Plan:  Continued routine obstetrical care, to perform stress relieving activities- if panic attacks or cramping worsens to let us know F/U in 4 for OB appointment

## 2014-02-25 ENCOUNTER — Telehealth (HOSPITAL_COMMUNITY): Payer: Self-pay | Admitting: *Deleted

## 2014-03-19 ENCOUNTER — Encounter: Payer: Self-pay | Admitting: Advanced Practice Midwife

## 2014-03-19 ENCOUNTER — Ambulatory Visit (INDEPENDENT_AMBULATORY_CARE_PROVIDER_SITE_OTHER): Payer: Self-pay | Admitting: Advanced Practice Midwife

## 2014-03-19 VITALS — BP 116/68 | Wt 209.0 lb

## 2014-03-19 DIAGNOSIS — O09892 Supervision of other high risk pregnancies, second trimester: Secondary | ICD-10-CM

## 2014-03-19 DIAGNOSIS — Z331 Pregnant state, incidental: Secondary | ICD-10-CM

## 2014-03-19 DIAGNOSIS — Z3482 Encounter for supervision of other normal pregnancy, second trimester: Secondary | ICD-10-CM

## 2014-03-19 DIAGNOSIS — Z1389 Encounter for screening for other disorder: Secondary | ICD-10-CM

## 2014-03-19 DIAGNOSIS — Z141 Cystic fibrosis carrier: Secondary | ICD-10-CM

## 2014-03-19 DIAGNOSIS — Z3480 Encounter for supervision of other normal pregnancy, unspecified trimester: Secondary | ICD-10-CM

## 2014-03-19 LAB — POCT URINALYSIS DIPSTICK
Blood, UA: NEGATIVE
GLUCOSE UA: NEGATIVE
Ketones, UA: NEGATIVE
LEUKOCYTES UA: NEGATIVE
NITRITE UA: NEGATIVE
Protein, UA: NEGATIVE

## 2014-03-19 NOTE — Patient Instructions (Signed)
1. Before your test, do not eat or drink anything for 8-10 hours prior to your  appointment (a small amount of water is allowed and you may take any medicines you normally take). Be sure to drink lots of water the day before. 2. When you arrive, your blood will be drawn for a 'fasting' blood sugar level.  Then you will be given a sweetened carbonated beverage to drink. You should  complete drinking this beverage within five minutes. After finishing the  beverage, you will have your blood drawn exactly 1 and 2 hours later. Having  your blood drawn on time is an important part of this test. A total of three blood  samples will be done. 3. The test takes approximately 2  hours. During the test, do not have anything to  eat or drink. Do not smoke, chew gum (not even sugarless gum) or use breath mints.  4. During the test you should remain close by and seated as much as possible and  avoid walking around. You may want to bring a book or something else to  occupy your time.  5. After your test, you may eat and drink as normal. You may want to bring a snack  to eat after the test is finished. Your provider will advise you as to the results of  this test and any follow-up if necessary  You will also be retested for syphilis, HIV and blood levels (anemia):  You were already tested in the first trimester, but [place] recommends retesting.  Additionally, you will be tested for Type 2 Herpes. MOST people do not know that they have genital herpes, as only around 15% of people have outbreaks.  However, it is still transmittable to other people, including the baby (but only during the birth).  If you test positive for Type 2 Herpes, we place you on a medicine called acyclovir the last 6 weeks of your pregnancy to prevent transmission of the virus to the baby during the birth.    If your sugar test is positive for gestational diabetes, you will be given an phone call and further instructions discussed.   We typically do not call patients with positive herpes results, but will discuss it at your next appointment.  If you wish to know all of your test results before your next appointment, feel free to call the office, or look up your test results on Mychart.  (The range that the lab uses for normal values of the sugar test are not necessarily the range that is used for pregnant women; if your results are within the range, they are definitely normal.  However, if a value is deemed "high" by the lab, it may not be too high for a pregnant woman.  We will need to discuss the normal range if your value(s) fall in the "high" category).     

## 2014-03-19 NOTE — Progress Notes (Signed)
[id number] 6039w1d Estimated Date of Delivery: 07/08/14  Blood pressure 116/68, weight 209 lb (94.802 kg), last menstrual period 02/04/2013.   BP weight and urine results all reviewed and noted.  Please refer to the obstetrical flow sheet for the fundal height and fetal heart rate documentation:  Patient reports good fetal movement, denies any bleeding and no rupture of membranes symptoms or regular contractions. Patient is without complaints except hard stools.  Already drinking water, fruits, veggies, etc.  May try FiberOne bars or stool softener. All questions were answered.  Plan:  Continued routine obstetrical care,   Follow up in 4 weeks for OB appointment, PN2

## 2014-03-26 ENCOUNTER — Ambulatory Visit (INDEPENDENT_AMBULATORY_CARE_PROVIDER_SITE_OTHER): Payer: Medicaid Other | Admitting: Obstetrics & Gynecology

## 2014-03-26 ENCOUNTER — Encounter: Payer: Self-pay | Admitting: Obstetrics & Gynecology

## 2014-03-26 VITALS — BP 110/80 | Wt 208.0 lb

## 2014-03-26 DIAGNOSIS — L02429 Furuncle of limb, unspecified: Secondary | ICD-10-CM

## 2014-03-26 DIAGNOSIS — Z331 Pregnant state, incidental: Secondary | ICD-10-CM

## 2014-03-26 DIAGNOSIS — Z1389 Encounter for screening for other disorder: Secondary | ICD-10-CM

## 2014-03-26 LAB — POCT URINALYSIS DIPSTICK
Blood, UA: NEGATIVE
Glucose, UA: NEGATIVE
Ketones, UA: NEGATIVE
Leukocytes, UA: NEGATIVE
NITRITE UA: NEGATIVE

## 2014-03-26 MED ORDER — SULFAMETHOXAZOLE-TMP DS 800-160 MG PO TABS: 1.0000 | ORAL_TABLET | Freq: Two times a day (BID) | ORAL | Status: DC

## 2014-03-26 NOTE — Addendum Note (Signed)
Addended by: Criss AlvinePULLIAM, Tamela Elsayed G on: 03/26/2014 04:07 PM   Modules accepted: Orders

## 2014-03-26 NOTE — Progress Notes (Signed)
Has a boil on inner left thigh consistent with MRSA been there for 4 days Will treat with bactrim ds x 10 days  [id number] 7851w1d Estimated Date of Delivery: 07/08/14  Blood pressure 110/80, weight 208 lb (94.348 kg), last menstrual period 02/04/2013.   BP weight and urine results all reviewed and noted.  Please refer to the obstetrical flow sheet for the fundal height and fetal heart rate documentation:  Patient reports good fetal movement, denies any bleeding and no rupture of membranes symptoms or regular contractions. Patient is without complaints. All questions were answered.  Plan:  Continued routine obstetrical care,   Follow up in keep weeks for OB appointment,

## 2014-04-17 ENCOUNTER — Ambulatory Visit (INDEPENDENT_AMBULATORY_CARE_PROVIDER_SITE_OTHER): Payer: Self-pay | Admitting: Advanced Practice Midwife

## 2014-04-17 ENCOUNTER — Other Ambulatory Visit: Payer: Medicaid Other

## 2014-04-17 VITALS — BP 120/66 | Wt 211.0 lb

## 2014-04-17 DIAGNOSIS — Z3480 Encounter for supervision of other normal pregnancy, unspecified trimester: Secondary | ICD-10-CM

## 2014-04-17 DIAGNOSIS — Z131 Encounter for screening for diabetes mellitus: Secondary | ICD-10-CM

## 2014-04-17 DIAGNOSIS — Z3483 Encounter for supervision of other normal pregnancy, third trimester: Secondary | ICD-10-CM

## 2014-04-17 DIAGNOSIS — Z331 Pregnant state, incidental: Secondary | ICD-10-CM

## 2014-04-17 DIAGNOSIS — Z1389 Encounter for screening for other disorder: Secondary | ICD-10-CM

## 2014-04-17 DIAGNOSIS — Z1159 Encounter for screening for other viral diseases: Secondary | ICD-10-CM

## 2014-04-17 DIAGNOSIS — Z0184 Encounter for antibody response examination: Secondary | ICD-10-CM

## 2014-04-17 LAB — CBC
HCT: 30.4 % — ABNORMAL LOW (ref 36.0–46.0)
Hemoglobin: 10.4 g/dL — ABNORMAL LOW (ref 12.0–15.0)
MCH: 29.7 pg (ref 26.0–34.0)
MCHC: 34.2 g/dL (ref 30.0–36.0)
MCV: 86.9 fL (ref 78.0–100.0)
PLATELETS: 261 10*3/uL (ref 150–400)
RBC: 3.5 MIL/uL — AB (ref 3.87–5.11)
RDW: 12.9 % (ref 11.5–15.5)
WBC: 8.8 10*3/uL (ref 4.0–10.5)

## 2014-04-17 LAB — POCT URINALYSIS DIPSTICK
GLUCOSE UA: NEGATIVE
KETONES UA: NEGATIVE
Leukocytes, UA: NEGATIVE
Nitrite, UA: NEGATIVE
Protein, UA: NEGATIVE
RBC UA: NEGATIVE

## 2014-04-17 NOTE — Progress Notes (Signed)
[id number] 809w2d Estimated Date of Delivery: 07/08/14  Blood pressure 120/66, weight 211 lb (95.709 kg), last menstrual period 02/04/2013.   BP weight and urine results all reviewed and noted.  Please refer to the obstetrical flow sheet for the fundal height and fetal heart rate documentation:  Patient reports good fetal movement, denies any bleeding and no rupture of membranes symptoms or regular contractions. Patient is without complaints. All questions were answered.  Plan:  Continued routine obstetrical care, Doing PN2 today  Follow up in 4 weeks for OB appointment,

## 2014-04-18 LAB — GLUCOSE TOLERANCE, 2 HOURS W/ 1HR
Glucose, 1 hour: 136 mg/dL (ref 70–170)
Glucose, 2 hour: 116 mg/dL (ref 70–139)
Glucose, Fasting: 86 mg/dL (ref 70–99)

## 2014-04-18 LAB — HSV 2 ANTIBODY, IGG

## 2014-04-18 LAB — HIV ANTIBODY (ROUTINE TESTING W REFLEX): HIV: NONREACTIVE

## 2014-04-18 LAB — ANTIBODY SCREEN: Antibody Screen: NEGATIVE

## 2014-04-18 LAB — RPR

## 2014-04-24 ENCOUNTER — Encounter: Payer: Self-pay | Admitting: Advanced Practice Midwife

## 2014-05-15 ENCOUNTER — Ambulatory Visit (INDEPENDENT_AMBULATORY_CARE_PROVIDER_SITE_OTHER): Payer: Medicaid Other | Admitting: Advanced Practice Midwife

## 2014-05-15 VITALS — BP 110/68 | Wt 213.0 lb

## 2014-05-15 DIAGNOSIS — Z3480 Encounter for supervision of other normal pregnancy, unspecified trimester: Secondary | ICD-10-CM

## 2014-05-15 DIAGNOSIS — Z331 Pregnant state, incidental: Secondary | ICD-10-CM

## 2014-05-15 DIAGNOSIS — Z1389 Encounter for screening for other disorder: Secondary | ICD-10-CM

## 2014-05-15 DIAGNOSIS — O2390 Unspecified genitourinary tract infection in pregnancy, unspecified trimester: Secondary | ICD-10-CM

## 2014-05-15 LAB — POCT URINALYSIS DIPSTICK
Blood, UA: NEGATIVE
GLUCOSE UA: NEGATIVE
KETONES UA: NEGATIVE
Nitrite, UA: NEGATIVE
Protein, UA: NEGATIVE

## 2014-05-15 NOTE — Progress Notes (Signed)
[id number] [redacted]w[redacted]d Estimated Date of Delivery: 07/08/14  Blood pressure 110/68, weight 213 lb (96.616 kg), last menstrual period 02/04/2013.   BP weight and urine results all reviewed and noted.  Please refer to the obstetrical flow sheet for the fundal height and fetal heart rate documentation:  Patient reports good fetal movement, denies any bleeding and no rupture of membranes symptoms or regular contractions. Patient c/o vaginal discharge the whole pregnancy, now feels itchy.  BH off and on, nothing consistant. SSE;  Normal appearing d/c, wet prep negative All questions were answered.  Plan:  Continued routine obstetrical care,   Follow up in 2 weeks for OB appointment,

## 2014-05-28 ENCOUNTER — Ambulatory Visit (INDEPENDENT_AMBULATORY_CARE_PROVIDER_SITE_OTHER): Payer: Medicaid Other | Admitting: Obstetrics & Gynecology

## 2014-05-28 ENCOUNTER — Encounter: Payer: Self-pay | Admitting: Obstetrics & Gynecology

## 2014-05-28 VITALS — BP 118/60 | Wt 212.0 lb

## 2014-05-28 DIAGNOSIS — Z331 Pregnant state, incidental: Secondary | ICD-10-CM

## 2014-05-28 DIAGNOSIS — Z3480 Encounter for supervision of other normal pregnancy, unspecified trimester: Secondary | ICD-10-CM

## 2014-05-28 DIAGNOSIS — Z3483 Encounter for supervision of other normal pregnancy, third trimester: Secondary | ICD-10-CM

## 2014-05-28 DIAGNOSIS — Z1389 Encounter for screening for other disorder: Secondary | ICD-10-CM

## 2014-05-28 LAB — POCT URINALYSIS DIPSTICK
Blood, UA: NEGATIVE
GLUCOSE UA: NEGATIVE
Ketones, UA: NEGATIVE
Leukocytes, UA: NEGATIVE
Nitrite, UA: NEGATIVE
Protein, UA: NEGATIVE

## 2014-05-28 NOTE — Addendum Note (Signed)
Addended by: Richardson Chiquito on: 05/28/2014 11:12 AM   Modules accepted: Orders

## 2014-05-28 NOTE — Progress Notes (Signed)
[id number] [redacted]w[redacted]d Estimated Date of Delivery: 07/08/14  Blood pressure 118/60, weight 212 lb (96.163 kg), last menstrual period 02/04/2013.   BP weight and urine results all reviewed and noted.  Please refer to the obstetrical flow sheet for the fundal height and fetal heart rate documentation:  Patient reports good fetal movement, denies any bleeding and no rupture of membranes symptoms or regular contractions. Patient is without complaints. All questions were answered.  Plan:  Continued routine obstetrical care.   Follow up in 2 weeks for OB appointment, GBS at that appointment.

## 2014-06-11 ENCOUNTER — Ambulatory Visit (INDEPENDENT_AMBULATORY_CARE_PROVIDER_SITE_OTHER): Payer: Medicaid Other | Admitting: Obstetrics & Gynecology

## 2014-06-11 VITALS — BP 120/62 | Wt 215.0 lb

## 2014-06-11 DIAGNOSIS — Z3480 Encounter for supervision of other normal pregnancy, unspecified trimester: Secondary | ICD-10-CM

## 2014-06-11 DIAGNOSIS — Z1389 Encounter for screening for other disorder: Secondary | ICD-10-CM

## 2014-06-11 DIAGNOSIS — Z331 Pregnant state, incidental: Secondary | ICD-10-CM

## 2014-06-11 DIAGNOSIS — Z3685 Encounter for antenatal screening for Streptococcus B: Secondary | ICD-10-CM

## 2014-06-11 DIAGNOSIS — Z1159 Encounter for screening for other viral diseases: Secondary | ICD-10-CM

## 2014-06-11 LAB — POCT URINALYSIS DIPSTICK
Glucose, UA: NEGATIVE
Ketones, UA: NEGATIVE
Nitrite, UA: NEGATIVE
Protein, UA: NEGATIVE
RBC UA: NEGATIVE

## 2014-06-11 NOTE — Progress Notes (Signed)
[id number] 4362w1d Estimated Date of Delivery: 07/08/14  Blood pressure 120/62, weight 215 lb (97.523 kg), last menstrual period 02/04/2013.   Having some heavy pressure mostly in back.  BP weight and urine results all reviewed and noted.  Please refer to the obstetrical flow sheet for the fundal height and fetal heart rate documentation:  Patient reports good fetal movement, denies any bleeding and no rupture of membranes symptoms or regular contractions. Patient is without complaints. All questions were answered.  Plan:  Continued routine obstetrical care, schedule induction for TOLAC at 5946w0d GBS done today  [facility]

## 2014-06-12 LAB — GC/CHLAMYDIA PROBE AMP
CT PROBE, AMP APTIMA: NEGATIVE
GC Probe RNA: NEGATIVE

## 2014-06-13 LAB — STREP B DNA PROBE: GBSP: NOT DETECTED

## 2014-06-18 ENCOUNTER — Encounter: Payer: Self-pay | Admitting: Obstetrics & Gynecology

## 2014-06-18 ENCOUNTER — Encounter: Payer: Self-pay | Admitting: Advanced Practice Midwife

## 2014-06-18 ENCOUNTER — Ambulatory Visit (INDEPENDENT_AMBULATORY_CARE_PROVIDER_SITE_OTHER): Payer: Medicaid Other | Admitting: Advanced Practice Midwife

## 2014-06-18 VITALS — BP 118/72 | Wt 215.0 lb

## 2014-06-18 DIAGNOSIS — Z23 Encounter for immunization: Secondary | ICD-10-CM

## 2014-06-18 DIAGNOSIS — Z1389 Encounter for screening for other disorder: Secondary | ICD-10-CM

## 2014-06-18 DIAGNOSIS — Z3483 Encounter for supervision of other normal pregnancy, third trimester: Secondary | ICD-10-CM

## 2014-06-18 DIAGNOSIS — Z331 Pregnant state, incidental: Secondary | ICD-10-CM

## 2014-06-18 LAB — POCT URINALYSIS DIPSTICK
GLUCOSE UA: NEGATIVE
Ketones, UA: NEGATIVE
Leukocytes, UA: NEGATIVE
Nitrite, UA: NEGATIVE
Protein, UA: NEGATIVE
RBC UA: NEGATIVE

## 2014-06-18 NOTE — Progress Notes (Signed)
Pt denies any problems or concerns at this time.  

## 2014-06-18 NOTE — Progress Notes (Signed)
[id number] 7356w1d Estimated Date of Delivery: 07/08/14  Blood pressure 118/72, weight 215 lb (97.523 kg), last menstrual period 02/04/2013.   BP weight and urine results all reviewed and noted.  Please refer to the obstetrical flow sheet for the fundal height and fetal heart rate documentation:  Patient reports good fetal movement, denies any bleeding and no rupture of membranes symptoms or regular contractions. Patient is without complaints. All questions were answered.  Plan:  Continued routine obstetrical care, flu shot today; plans tdap today  Follow up in 1 weeks for OB appointment,

## 2014-06-19 ENCOUNTER — Encounter (HOSPITAL_COMMUNITY): Payer: Self-pay | Admitting: Pharmacist

## 2014-06-25 ENCOUNTER — Ambulatory Visit (INDEPENDENT_AMBULATORY_CARE_PROVIDER_SITE_OTHER): Payer: Medicaid Other | Admitting: Obstetrics & Gynecology

## 2014-06-25 ENCOUNTER — Encounter: Payer: Medicaid Other | Admitting: Women's Health

## 2014-06-25 ENCOUNTER — Encounter: Payer: Self-pay | Admitting: Obstetrics & Gynecology

## 2014-06-25 VITALS — BP 112/70 | Wt 216.0 lb

## 2014-06-25 DIAGNOSIS — Z331 Pregnant state, incidental: Secondary | ICD-10-CM

## 2014-06-25 DIAGNOSIS — Z98891 History of uterine scar from previous surgery: Secondary | ICD-10-CM

## 2014-06-25 DIAGNOSIS — Z1389 Encounter for screening for other disorder: Secondary | ICD-10-CM

## 2014-06-25 DIAGNOSIS — Z3483 Encounter for supervision of other normal pregnancy, third trimester: Secondary | ICD-10-CM | POA: Insufficient documentation

## 2014-06-25 LAB — POCT URINALYSIS DIPSTICK
Blood, UA: NEGATIVE
Glucose, UA: NEGATIVE
KETONES UA: NEGATIVE
Nitrite, UA: NEGATIVE
PROTEIN UA: NEGATIVE

## 2014-06-25 NOTE — Progress Notes (Signed)
[id number] 1555w1d Estimated Date of Delivery: 07/08/14  Blood pressure 112/70, weight 216 lb (97.977 kg), last menstrual period 02/04/2013.   BP weight and urine results all reviewed and noted.  Please refer to the obstetrical flow sheet for the fundal height and fetal heart rate documentation:  Patient reports good fetal movement, denies any bleeding and no rupture of membranes symptoms or regular contractions. Patient is without complaints. All questions were answered.  Plan:  Continued routine obstetrical care,   Follow up in 2 weeks  weeks for OB appointment, post op

## 2014-06-25 NOTE — Patient Instructions (Addendum)
   Your procedure is scheduled on:  Monday, Oct 19  Enter through the Main [facility]tr[facility] of [facility] at:  1115 AM Pick up the phone at the desk and dial [phone] and inform us of your arrival.  Please call this number if you have any problems the morning of surgery: [phone]  Remember: Do not eat or drink after midnight: Sunday Take these medicines the morning of surgery with a SIP OF WATER:  None  Do not wear jewelry, make-up, or FINGER nail polish No metal in your hair or on your body. Do not wear lotions, powders, perfumes.  You may wear deodorant.  Do not bring valuables to the hospital. Contacts, dentures or bridgework may not be worn into surgery.  Leave suitcase in the car. After Surgery it may be brought to your room. For patients being admitted to the hospital, checkout time is 11:00am the day of discharge.   Home with FOB [place] cell [phone].

## 2014-06-27 ENCOUNTER — Other Ambulatory Visit: Payer: Self-pay | Admitting: Obstetrics & Gynecology

## 2014-06-28 ENCOUNTER — Encounter (HOSPITAL_COMMUNITY)
Admission: RE | Admit: 2014-06-28 | Discharge: 2014-06-28 | Disposition: A | Discharge status: Home / Self Care | Payer: Medicaid Other | Source: Ambulatory Visit | Attending: Obstetrics & Gynecology | Admitting: Obstetrics & Gynecology

## 2014-06-28 ENCOUNTER — Encounter (HOSPITAL_COMMUNITY): Payer: Self-pay

## 2014-06-28 HISTORY — DX: Gastro-esophageal reflux disease without esophagitis: K21.9

## 2014-06-28 HISTORY — DX: Anemia, unspecified: D64.9

## 2014-06-28 LAB — CBC
HEMATOCRIT: 32.5 % — AB (ref 36.0–46.0)
HEMOGLOBIN: 11.4 g/dL — AB (ref 12.0–15.0)
MCH: 29.6 pg (ref 26.0–34.0)
MCHC: 35.1 g/dL (ref 30.0–36.0)
MCV: 84.4 fL (ref 78.0–100.0)
Platelets: 206 10*3/uL (ref 150–400)
RBC: 3.85 MIL/uL — ABNORMAL LOW (ref 3.87–5.11)
RDW: 12.8 % (ref 11.5–15.5)
WBC: 9.3 10*3/uL (ref 4.0–10.5)

## 2014-06-28 LAB — ABO/RH: ABO/RH(D): O POS

## 2014-06-28 LAB — RPR

## 2014-06-30 MED ORDER — GENTAMICIN SULFATE 40 MG/ML IJ SOLN
INTRAVENOUS | Status: AC
  Administered 2014-07-01: 114.75 mL via INTRAVENOUS
  Filled 2014-06-30: qty 8.75

## 2014-07-01 ENCOUNTER — Encounter (HOSPITAL_COMMUNITY): Payer: Self-pay | Admitting: Anesthesiology

## 2014-07-01 ENCOUNTER — Inpatient Hospital Stay (HOSPITAL_COMMUNITY): Payer: Medicaid Other | Admitting: Anesthesiology

## 2014-07-01 ENCOUNTER — Encounter (HOSPITAL_COMMUNITY): Payer: Medicaid Other | Admitting: Anesthesiology

## 2014-07-01 ENCOUNTER — Inpatient Hospital Stay (HOSPITAL_COMMUNITY)
Admission: RE | Admit: 2014-07-01 | Discharge: 2014-07-03 | DRG: 766 | Disposition: A | Discharge status: Home / Self Care | Payer: Medicaid Other | Source: Ambulatory Visit | Attending: Obstetrics & Gynecology | Admitting: Obstetrics & Gynecology

## 2014-07-01 ENCOUNTER — Encounter (HOSPITAL_COMMUNITY)
Admission: RE | Disposition: A | Discharge status: Home / Self Care | Payer: Self-pay | Source: Ambulatory Visit | Attending: Obstetrics & Gynecology

## 2014-07-01 DIAGNOSIS — Z8249 Family history of ischemic heart disease and other diseases of the circulatory system: Secondary | ICD-10-CM | POA: Diagnosis not present

## 2014-07-01 DIAGNOSIS — Z87891 Personal history of nicotine dependence: Secondary | ICD-10-CM

## 2014-07-01 DIAGNOSIS — K219 Gastro-esophageal reflux disease without esophagitis: Secondary | ICD-10-CM | POA: Diagnosis present

## 2014-07-01 DIAGNOSIS — O3421 Maternal care for scar from previous cesarean delivery: Secondary | ICD-10-CM | POA: Diagnosis present

## 2014-07-01 DIAGNOSIS — Z3A39 39 weeks gestation of pregnancy: Secondary | ICD-10-CM | POA: Diagnosis present

## 2014-07-01 DIAGNOSIS — Z6839 Body mass index (BMI) 39.0-39.9, adult: Secondary | ICD-10-CM

## 2014-07-01 DIAGNOSIS — Z141 Cystic fibrosis carrier: Secondary | ICD-10-CM | POA: Diagnosis not present

## 2014-07-01 DIAGNOSIS — Z98891 History of uterine scar from previous surgery: Secondary | ICD-10-CM

## 2014-07-01 DIAGNOSIS — O99613 Diseases of the digestive system complicating pregnancy, third trimester: Secondary | ICD-10-CM | POA: Diagnosis present

## 2014-07-01 DIAGNOSIS — O99214 Obesity complicating childbirth: Secondary | ICD-10-CM | POA: Diagnosis present

## 2014-07-01 LAB — PREPARE RBC (CROSSMATCH)

## 2014-07-01 SURGERY — Surgical Case
Anesthesia: Spinal | Site: Abdomen

## 2014-07-01 MED ORDER — TETANUS-DIPHTH-ACELL PERTUSSIS 5-2.5-18.5 LF-MCG/0.5 IM SUSP
0.5000 mL | Freq: Once | INTRAMUSCULAR | Status: AC
  Administered 2014-07-02: 0.5 mL via INTRAMUSCULAR

## 2014-07-01 MED ORDER — MEPERIDINE HCL 25 MG/ML IJ SOLN: 6.2500 mg | INTRAMUSCULAR | Status: DC | PRN

## 2014-07-01 MED ORDER — ONDANSETRON HCL 4 MG/2ML IJ SOLN
INTRAMUSCULAR | Status: DC | PRN
  Administered 2014-07-01: 4 mg via INTRAVENOUS

## 2014-07-01 MED ORDER — FENTANYL CITRATE 0.05 MG/ML IJ SOLN
INTRAMUSCULAR | Status: AC
Start: 2014-07-01 — End: 2014-07-01
  Filled 2014-07-01: qty 2

## 2014-07-01 MED ORDER — PHENYLEPHRINE 8 MG IN D5W 100 ML (0.08MG/ML) PREMIX OPTIME
INJECTION | INTRAVENOUS | Status: AC
  Filled 2014-07-01: qty 100

## 2014-07-01 MED ORDER — DIBUCAINE 1 % RE OINT: 1.0000 "application " | TOPICAL_OINTMENT | RECTAL | Status: DC | PRN

## 2014-07-01 MED ORDER — LANOLIN HYDROUS EX OINT: 1.0000 "application " | TOPICAL_OINTMENT | CUTANEOUS | Status: DC | PRN

## 2014-07-01 MED ORDER — WITCH HAZEL-GLYCERIN EX PADS
1.0000 | MEDICATED_PAD | CUTANEOUS | Status: DC | PRN
Start: 2014-07-01 — End: 2014-07-01

## 2014-07-01 MED ORDER — IBUPROFEN 600 MG PO TABS
600.0000 mg | ORAL_TABLET | Freq: Four times a day (QID) | ORAL | Status: DC
  Administered 2014-07-02 – 2014-07-03 (×6): 600 mg via ORAL
  Filled 2014-07-01 (×6): qty 1

## 2014-07-01 MED ORDER — BUPIVACAINE LIPOSOME 1.3 % IJ SUSP
INTRAMUSCULAR | Status: DC | PRN
  Administered 2014-07-01: 20 mL

## 2014-07-01 MED ORDER — WITCH HAZEL-GLYCERIN EX PADS: 1.0000 "application " | MEDICATED_PAD | CUTANEOUS | Status: DC | PRN

## 2014-07-01 MED ORDER — BUPIVACAINE LIPOSOME 1.3 % IJ SUSP
20.0000 mL | Freq: Once | INTRAMUSCULAR | Status: DC
  Filled 2014-07-01: qty 20

## 2014-07-01 MED ORDER — DIPHENHYDRAMINE HCL 25 MG PO CAPS
25.0000 mg | ORAL_CAPSULE | ORAL | Status: DC | PRN
  Filled 2014-07-01: qty 1

## 2014-07-01 MED ORDER — SCOPOLAMINE 1 MG/3DAYS TD PT72
MEDICATED_PATCH | TRANSDERMAL | Status: AC
  Administered 2014-07-01: 1.5 mg via TRANSDERMAL
  Filled 2014-07-01: qty 1

## 2014-07-01 MED ORDER — KETOROLAC TROMETHAMINE 30 MG/ML IJ SOLN
30.0000 mg | Freq: Four times a day (QID) | INTRAMUSCULAR | Status: DC | PRN
  Administered 2014-07-01: 30 mg via INTRAVENOUS
  Filled 2014-07-01: qty 1

## 2014-07-01 MED ORDER — MENTHOL 3 MG MT LOZG: 1.0000 | LOZENGE | OROMUCOSAL | Status: DC | PRN

## 2014-07-01 MED ORDER — SODIUM CHLORIDE 0.9 % IJ SOLN
INTRAMUSCULAR | Status: DC | PRN
  Administered 2014-07-01: 50 mL via INTRAVENOUS

## 2014-07-01 MED ORDER — BUPIVACAINE IN DEXTROSE 0.75-8.25 % IT SOLN
INTRATHECAL | Status: DC | PRN
  Administered 2014-07-01: 10.5 mg via INTRATHECAL

## 2014-07-01 MED ORDER — NALBUPHINE HCL 10 MG/ML IJ SOLN
5.0000 mg | Freq: Once | INTRAMUSCULAR | Status: AC | PRN
  Administered 2014-07-01: 5 mg via SUBCUTANEOUS

## 2014-07-01 MED ORDER — SODIUM CHLORIDE 0.9 % IJ SOLN
INTRAMUSCULAR | Status: DC | PRN
  Administered 2014-07-01: 40 mL

## 2014-07-01 MED ORDER — PHENYLEPHRINE HCL 10 MG/ML IJ SOLN
INTRAMUSCULAR | Status: DC | PRN
  Administered 2014-07-01: 80 ug via INTRAVENOUS

## 2014-07-01 MED ORDER — OXYCODONE-ACETAMINOPHEN 5-325 MG PO TABS
2.0000 | ORAL_TABLET | ORAL | Status: DC | PRN
  Administered 2014-07-02 – 2014-07-03 (×6): 2 via ORAL
  Filled 2014-07-01 (×6): qty 2

## 2014-07-01 MED ORDER — METHYLERGONOVINE MALEATE 0.2 MG/ML IJ SOLN: 0.2000 mg | INTRAMUSCULAR | Status: DC | PRN

## 2014-07-01 MED ORDER — NALBUPHINE HCL 10 MG/ML IJ SOLN
INTRAMUSCULAR | Status: AC
  Administered 2014-07-01: 5 mg via SUBCUTANEOUS
  Filled 2014-07-01: qty 1

## 2014-07-01 MED ORDER — ONDANSETRON HCL 4 MG/2ML IJ SOLN: 4.0000 mg | INTRAMUSCULAR | Status: DC | PRN

## 2014-07-01 MED ORDER — ONDANSETRON HCL 4 MG PO TABS
4.0000 mg | ORAL_TABLET | ORAL | Status: DC | PRN
  Administered 2014-07-03: 4 mg via ORAL
  Filled 2014-07-01: qty 1

## 2014-07-01 MED ORDER — OXYCODONE-ACETAMINOPHEN 5-325 MG PO TABS
1.0000 | ORAL_TABLET | ORAL | Status: DC | PRN
  Administered 2014-07-02: 1 via ORAL
  Filled 2014-07-01: qty 1

## 2014-07-01 MED ORDER — LACTATED RINGERS IV SOLN: INTRAVENOUS | Status: DC

## 2014-07-01 MED ORDER — LACTATED RINGERS IV SOLN
INTRAVENOUS | Status: DC | PRN
  Administered 2014-07-01: 13:00:00 via INTRAVENOUS

## 2014-07-01 MED ORDER — ZOLPIDEM TARTRATE 5 MG PO TABS: 5.0000 mg | ORAL_TABLET | Freq: Every evening | ORAL | Status: DC | PRN

## 2014-07-01 MED ORDER — SIMETHICONE 80 MG PO CHEW
80.0000 mg | CHEWABLE_TABLET | Freq: Three times a day (TID) | ORAL | Status: DC
  Administered 2014-07-02 – 2014-07-03 (×5): 80 mg via ORAL
  Filled 2014-07-01 (×5): qty 1

## 2014-07-01 MED ORDER — KETOROLAC TROMETHAMINE 30 MG/ML IJ SOLN
30.0000 mg | Freq: Four times a day (QID) | INTRAMUSCULAR | Status: DC | PRN
Start: 2014-07-01 — End: 2014-07-01
  Administered 2014-07-01: 30 mg via INTRAMUSCULAR

## 2014-07-01 MED ORDER — METHYLERGONOVINE MALEATE 0.2 MG PO TABS: 0.2000 mg | ORAL_TABLET | ORAL | Status: DC | PRN

## 2014-07-01 MED ORDER — PRENATAL MULTIVITAMIN CH
1.0000 | ORAL_TABLET | Freq: Every day | ORAL | Status: DC
  Administered 2014-07-02 – 2014-07-03 (×2): 1 via ORAL
  Filled 2014-07-01 (×2): qty 1

## 2014-07-01 MED ORDER — SCOPOLAMINE 1 MG/3DAYS TD PT72
1.0000 | MEDICATED_PATCH | Freq: Once | TRANSDERMAL | Status: DC
  Administered 2014-07-01: 1.5 mg via TRANSDERMAL

## 2014-07-01 MED ORDER — NALBUPHINE HCL 10 MG/ML IJ SOLN: 5.0000 mg | Freq: Once | INTRAMUSCULAR | Status: AC | PRN

## 2014-07-01 MED ORDER — KETOROLAC TROMETHAMINE 30 MG/ML IJ SOLN
INTRAMUSCULAR | Status: AC
  Administered 2014-07-01: 30 mg via INTRAMUSCULAR
  Filled 2014-07-01: qty 1

## 2014-07-01 MED ORDER — NALOXONE HCL 0.4 MG/ML IJ SOLN: 0.4000 mg | INTRAMUSCULAR | Status: DC | PRN

## 2014-07-01 MED ORDER — PHENYLEPHRINE 40 MCG/ML (10ML) SYRINGE FOR IV PUSH (FOR BLOOD PRESSURE SUPPORT)
PREFILLED_SYRINGE | INTRAVENOUS | Status: AC
  Filled 2014-07-01: qty 5

## 2014-07-01 MED ORDER — ONDANSETRON HCL 4 MG/2ML IJ SOLN
INTRAMUSCULAR | Status: AC
  Filled 2014-07-01: qty 2

## 2014-07-01 MED ORDER — FENTANYL CITRATE 0.05 MG/ML IJ SOLN: 25.0000 ug | INTRAMUSCULAR | Status: DC | PRN

## 2014-07-01 MED ORDER — DEXTROSE 5 % IV SOLN
1.0000 ug/kg/h | INTRAVENOUS | Status: DC | PRN
  Filled 2014-07-01: qty 2

## 2014-07-01 MED ORDER — NALBUPHINE HCL 10 MG/ML IJ SOLN: 5.0000 mg | INTRAMUSCULAR | Status: DC | PRN

## 2014-07-01 MED ORDER — OXYTOCIN 10 UNIT/ML IJ SOLN
40.0000 [IU] | INTRAVENOUS | Status: DC | PRN
  Administered 2014-07-01: 40 [IU] via INTRAVENOUS

## 2014-07-01 MED ORDER — FENTANYL CITRATE 0.05 MG/ML IJ SOLN
INTRAMUSCULAR | Status: DC | PRN
Start: 2014-07-01 — End: 2014-07-01
  Administered 2014-07-01: 25 ug via INTRATHECAL

## 2014-07-01 MED ORDER — MORPHINE SULFATE 0.5 MG/ML IJ SOLN
INTRAMUSCULAR | Status: AC
  Filled 2014-07-01: qty 10

## 2014-07-01 MED ORDER — MORPHINE SULFATE (PF) 0.5 MG/ML IJ SOLN
INTRAMUSCULAR | Status: DC | PRN
  Administered 2014-07-01: .15 mg via INTRATHECAL

## 2014-07-01 MED ORDER — SODIUM CHLORIDE 0.9 % IJ SOLN: 3.0000 mL | INTRAMUSCULAR | Status: DC | PRN

## 2014-07-01 MED ORDER — ACETAMINOPHEN 500 MG PO TABS
1000.0000 mg | ORAL_TABLET | Freq: Four times a day (QID) | ORAL | Status: DC | PRN
  Administered 2014-07-01 – 2014-07-02 (×2): 1000 mg via ORAL
  Filled 2014-07-01 (×2): qty 2

## 2014-07-01 MED ORDER — OXYTOCIN 40 UNITS IN LACTATED RINGERS INFUSION - SIMPLE MED: 62.5000 mL/h | INTRAVENOUS | Status: AC

## 2014-07-01 MED ORDER — SODIUM CHLORIDE 0.9 % IJ SOLN
INTRAMUSCULAR | Status: AC
  Filled 2014-07-01: qty 50

## 2014-07-01 MED ORDER — DIPHENHYDRAMINE HCL 25 MG PO CAPS
25.0000 mg | ORAL_CAPSULE | Freq: Four times a day (QID) | ORAL | Status: DC | PRN
  Administered 2014-07-02: 25 mg via ORAL
  Filled 2014-07-01: qty 1

## 2014-07-01 MED ORDER — DIPHENHYDRAMINE HCL 50 MG/ML IJ SOLN: 12.5000 mg | INTRAMUSCULAR | Status: DC | PRN

## 2014-07-01 MED ORDER — SENNOSIDES-DOCUSATE SODIUM 8.6-50 MG PO TABS
2.0000 | ORAL_TABLET | ORAL | Status: DC
  Administered 2014-07-02 – 2014-07-03 (×2): 2 via ORAL
  Filled 2014-07-01 (×2): qty 2

## 2014-07-01 MED ORDER — PHENYLEPHRINE 8 MG IN D5W 100 ML (0.08MG/ML) PREMIX OPTIME
INJECTION | INTRAVENOUS | Status: DC | PRN
  Administered 2014-07-01: 60 ug/min via INTRAVENOUS

## 2014-07-01 MED ORDER — ONDANSETRON HCL 4 MG/2ML IJ SOLN: 4.0000 mg | Freq: Three times a day (TID) | INTRAMUSCULAR | Status: DC | PRN

## 2014-07-01 MED ORDER — LACTATED RINGERS IV SOLN
INTRAVENOUS | Status: DC
  Administered 2014-07-01 (×3): via INTRAVENOUS

## 2014-07-01 MED ORDER — OXYTOCIN 10 UNIT/ML IJ SOLN
INTRAMUSCULAR | Status: AC
  Filled 2014-07-01: qty 4

## 2014-07-01 SURGICAL SUPPLY — 39 items
ADH SKN CLS APL DERMABOND .7 (GAUZE/BANDAGES/DRESSINGS) ×1
CLAMP CORD UMBIL (MISCELLANEOUS) IMPLANT
CLOTH BEACON ORANGE TIMEOUT ST (SAFETY) ×3 IMPLANT
DERMABOND ADVANCED (GAUZE/BANDAGES/DRESSINGS) ×2
DERMABOND ADVANCED .7 DNX12 (GAUZE/BANDAGES/DRESSINGS) ×2 IMPLANT
DRAPE SHEET LG 3/4 BI-LAMINATE (DRAPES) IMPLANT
DRSG OPSITE POSTOP 4X10 (GAUZE/BANDAGES/DRESSINGS) ×3 IMPLANT
DURAPREP 26ML APPLICATOR (WOUND CARE) ×6 IMPLANT
ELECT REM PT RETURN 9FT ADLT (ELECTROSURGICAL) ×3
ELECTRODE REM PT RTRN 9FT ADLT (ELECTROSURGICAL) ×1 IMPLANT
EXTRACTOR VACUUM BELL STYLE (SUCTIONS) IMPLANT
GLOVE BIOGEL PI IND STRL 8 (GLOVE) ×1 IMPLANT
GLOVE BIOGEL PI INDICATOR 8 (GLOVE) ×2
GLOVE ECLIPSE 8.0 STRL XLNG CF (GLOVE) ×3 IMPLANT
GOWN STRL REUS W/TWL LRG LVL3 (GOWN DISPOSABLE) ×6 IMPLANT
KIT ABG SYR 3ML LUER SLIP (SYRINGE) ×3 IMPLANT
NDL HYPO 18GX1.5 BLUNT FILL (NEEDLE) ×1 IMPLANT
NDL HYPO 25X5/8 SAFETYGLIDE (NEEDLE) ×1 IMPLANT
NEEDLE HYPO 18GX1.5 BLUNT FILL (NEEDLE) ×3 IMPLANT
NEEDLE HYPO 22GX1.5 SAFETY (NEEDLE) ×3 IMPLANT
NEEDLE HYPO 25X5/8 SAFETYGLIDE (NEEDLE) ×3 IMPLANT
NS IRRIG 1000ML POUR BTL (IV SOLUTION) ×5 IMPLANT
PACK C SECTION WH (CUSTOM PROCEDURE TRAY) ×3 IMPLANT
PAD OB MATERNITY 4.3X12.25 (PERSONAL CARE ITEMS) ×3 IMPLANT
RTRCTR C-SECT PINK 25CM LRG (MISCELLANEOUS) IMPLANT
STAPLER VISISTAT 35W (STAPLE) IMPLANT
SUT CHROMIC 0 CT 1 (SUTURE) ×3 IMPLANT
SUT MNCRL 0 VIOLET CTX 36 (SUTURE) ×2 IMPLANT
SUT MONOCRYL 0 CTX 36 (SUTURE) ×4
SUT PLAIN 2 0 (SUTURE)
SUT PLAIN 2 0 XLH (SUTURE) IMPLANT
SUT PLAIN ABS 2-0 CT1 27XMFL (SUTURE) IMPLANT
SUT VIC AB 0 CTX 36 (SUTURE) ×3
SUT VIC AB 0 CTX36XBRD ANBCTRL (SUTURE) ×1 IMPLANT
SUT VIC AB 4-0 KS 27 (SUTURE) ×2 IMPLANT
SYR 20CC LL (SYRINGE) ×6 IMPLANT
TOWEL OR 17X24 6PK STRL BLUE (TOWEL DISPOSABLE) ×3 IMPLANT
TRAY FOLEY CATH 14FR (SET/KITS/TRAYS/PACK) IMPLANT
WATER STERILE IRR 1000ML POUR (IV SOLUTION) ×1 IMPLANT

## 2014-07-01 NOTE — Anesthesia Procedure Notes (Signed)
Spinal  Patient location during procedure: OR Start time: 07/01/2014 12:50 PM Staffing Anesthesiologist: Kara Melching A. Performed by: anesthesiologist  Preanesthetic Checklist Completed: patient identified, site marked, surgical consent, pre-op evaluation, timeout performed, IV checked, risks and benefits discussed and monitors and equipment checked Spinal Block Patient position: sitting Prep: site prepped and draped and DuraPrep Patient monitoring: heart rate, cardiac monitor, continuous pulse ox and blood pressure Approach: midline Location: L3-4 Injection technique: single-shot Needle Needle type: Sprotte  Needle gauge: 24 G Needle length: 9 cm Needle insertion depth: 6.5 cm Assessment Sensory level: T4 Additional Notes Patient tolerated procedure well. Adequate sensory block. Attempt x 2.

## 2014-07-01 NOTE — Anesthesia Postprocedure Evaluation (Signed)
  Anesthesia Post-op Note  Patient: Kathryn Vasquez  Procedure(s) Performed: Procedure(s): REPEAT CESAREAN SECTION (N/A)  Patient Location: PACU  Anesthesia Type:Spinal  Level of Consciousness: awake, alert  and oriented  Airway and Oxygen Therapy: Patient Spontanous Breathing  Post-op Pain: none  Post-op Assessment: Post-op Vital signs reviewed, Patient's Cardiovascular Status Stable, Respiratory Function Stable, Patent Airway, No signs of Nausea or vomiting, Pain level controlled, No headache and No backache  Post-op Vital Signs: Reviewed and stable  Last Vitals:  Filed Vitals:   07/01/14 1430  BP: 108/63  Pulse: 75  Temp:   Resp: 25    Complications: No apparent anesthesia complications

## 2014-07-01 NOTE — Progress Notes (Addendum)
Called MD regarding patient's pain; requesting Tylenol.  Notified MD of triple orders.  Will continue to monitor.  Vivi MartensAshley Markell Sciascia RN

## 2014-07-01 NOTE — Transfer of Care (Signed)
Immediate Anesthesia Transfer of Care Note  Patient: Kathryn Vasquez  Procedure(s) Performed: Procedure(s): REPEAT CESAREAN SECTION (N/A)  Patient Location: PACU  Anesthesia Type:Spinal  Level of Consciousness: awake, alert , oriented and patient cooperative  Airway & Oxygen Therapy: Patient Spontanous Breathing  Post-op Assessment: Report given to PACU RN and Post -op Vital signs reviewed and stable  Post vital signs: Reviewed and stable  Complications: No apparent anesthesia complications

## 2014-07-01 NOTE — Op Note (Signed)
Preoperative diagnosis:  1.  Intrauterine pregnancy at 1248w0d  weeks gestation                                         2.  Previous Caesareans section                                         3.  Declines TOL   Postoperative diagnosis:  Same as above   Procedure:  Repeat cesarean section  Surgeon:  Lazaro ArmsLuther H Zilah Villaflor MD  Assistant:    Anesthesia: Spinal  Findings:  .    Over a low transverse incision was delivered a viable female with Apgars of 9 and 9 weighing  lbs.  oz. Uterus, tubes and ovaries were all normal.  There were no other significant findings  Description of operation:  Patient was taken to the operating room and placed in the sitting position where she underwent a spinal anesthetic. She was then placed in the supine position with tilt to the left side. When adequate anesthetic level was obtained she was prepped and draped in usual sterile fashion and a Foley catheter was placed. A Pfannenstiel skin incision was made and carried down sharply to the rectus fascia which was scored in the midline extended laterally. The fascia was taken off the muscles both superiorly and without difficulty. The muscles were divided.  The peritoneal cavity was entered.  Bladder blade was placed, no bladder flap was created.  A low transverse hysterotomy incision was made and delivered a viable female  infant at [address] with Apgars of 9 and 9 weighing lbs  oz.  Cord pH was obtained and was pending. The uterus was exteriorized. It was closed in 2 layers, the first being a running interlocking layer and the second being an imbricating layer using 0 monocryl on a CTX needle. There was good resulting hemostasis. The uterus tubes and ovaries were all normal. Peritoneal cavity was irrigated vigorously. The muscles and peritoneum were reapproximated loosely. The fascia was closed using 0 Vicryl in running fashion. Subcutaneous tissue was made hemostatic and irrigated. The skin was closed using 4-0 Vicryl on a Keith needle in  a subcuticular fashion.  Dermabond was placed for additional wound integrity and to serve as a barrier. Blood loss for the procedure was 750 cc. The patient received Gentamicin and clindamycin prophylactically. The patient was taken to the recovery room in good stable condition with all counts being correct x3.  EBL 750 cc  Annya Lizana H 07/01/2014 4:03 PM

## 2014-07-01 NOTE — Anesthesia Preprocedure Evaluation (Addendum)
Anesthesia Evaluation  Patient identified by MRN, date of birth, ID band Patient awake    Reviewed: Allergy & Precautions, H&P , NPO status , Patient's Chart, lab work & pertinent test results  Airway Mallampati: III TM Distance: >3 FB Neck ROM: Full    Dental no notable dental hx. (+) Teeth Intact   Pulmonary former smoker,  CF carrier breath sounds clear to auscultation  Pulmonary exam normal       Cardiovascular Rhythm:Regular Rate:Normal     Neuro/Psych  Headaches, PSYCHIATRIC DISORDERS Anxiety Depression    GI/Hepatic Neg liver ROS, GERD-  Medicated and Controlled,  Endo/Other  Morbid obesity  Renal/GU Renal diseasenegative Renal ROS  negative genitourinary   Musculoskeletal negative musculoskeletal ROS (+)   Abdominal (+) + obese,   Peds  Hematology  (+) anemia ,   Anesthesia Other Findings   Reproductive/Obstetrics (+) Pregnancy Previous C/Section                         Anesthesia Physical Anesthesia Plan  ASA: III  Anesthesia Plan: Spinal   Post-op Pain Management:    Induction:   Airway Management Planned: Natural Airway  Additional Equipment:   Intra-op Plan:   Post-operative Plan:   Informed Consent: I have reviewed the patients History and Physical, chart, labs and discussed the procedure including the risks, benefits and alternatives for the proposed anesthesia with the patient or authorized representative who has indicated his/her understanding and acceptance.     Plan Discussed with: Anesthesiologist, CRNA and Surgeon  Anesthesia Plan Comments:         Anesthesia Quick Evaluation

## 2014-07-01 NOTE — H&P (Signed)
Preoperative History and Physical  Kathryn Vasquez is a 23 y.o. [id number] with Patient's last menstrual period was 02/04/2013. 6838w0d Estimated Date of Delivery: 07/08/14  admitted for a repeat Caesarean section.  Had a previous Caesarean section for arrest of descent, declines trial of labor  PMH:    Past Medical History  Diagnosis Date  . Depression   . Anxiety   . First trimester bleeding 04/16/2013  . Pregnancy   . Pregnant 11/12/2013  . Vaginal bleeding in pregnant patient at less than [redacted] weeks gestation 11/12/2013  . Supervision of normal pregnancy 11/26/2013  . H/O: cesarean section 11/26/2013  . Nausea and vomiting in pregnancy 11/26/2013  . GERD (gastroesophageal reflux disease)     with pregnancy - tx with tums  . Migraine     no meds, just sleep/rest  . Anemia     Hx    PSH:     Past Surgical History  Procedure Laterality Date  . Cesarean section  2010    x 1 WH  . Bladder stretch      at age 89 yrs    POb/GynH:      OB History   Grav Para Term Preterm Abortions TAB SAB Ect Mult Living   3 1 1  0 1 0 1 0 0 1      SH:   History  Substance Use Topics  . Smoking status: Former Smoker -- 0.50 packs/day for 1 years    Types: Cigarettes    Quit date: 10/14/2013  . Smokeless tobacco: Never Used  . Alcohol Use: No    FH:    Family History  Problem Relation Age of Onset  . Cancer Maternal Grandmother     breast  . Hypertension Mother      Allergies:  Allergies  Allergen Reactions  . Ceclor [Cefaclor] Rash  . Keflex [Cephalexin] Rash  . Latex Rash    Medications:      Current facility-administered medications:bupivacaine liposome (EXPAREL) 1.3 % injection 266 mg, 20 mL, Infiltration, Once, Lazaro ArmsLuther H Eure, MD;  gentamicin (GARAMYCIN) 350 mg, clindamycin (CLEOCIN) 900 mg in dextrose 5 % 100 mL IVPB, , Intravenous, On Call to OR, Mindy SwazilandJordan Holcombe, RPH;  lactated ringers infusion, , Intravenous, Continuous, Dana AllanAmy Cassidy, MD, Last Rate: 125 mL/hr at 07/01/14  1134 scopolamine (TRANSDERM-SCOP) 1 MG/3DAYS 1.5 mg, 1 patch, Transdermal, Once, Dana AllanAmy Cassidy, MD, 1.5 mg at 07/01/14 1135  Review of Systems:   Review of Systems  Constitutional: Negative for fever, chills, weight loss, malaise/fatigue and diaphoresis.  HENT: Negative for hearing loss, ear pain, nosebleeds, congestion, sore throat, neck pain, tinnitus and ear discharge.   Eyes: Negative for blurred vision, double vision, photophobia, pain, discharge and redness.  Respiratory: Negative for cough, hemoptysis, sputum production, shortness of breath, wheezing and stridor.   Cardiovascular: Negative for chest pain, palpitations, orthopnea, claudication, leg swelling and PND.  Gastrointestinal: Positive for abdominal pain. Negative for heartburn, nausea, vomiting, diarrhea, constipation, blood in stool and melena.  Genitourinary: Negative for dysuria, urgency, frequency, hematuria and flank pain.  Musculoskeletal: Negative for myalgias, back pain, joint pain and falls.  Skin: Negative for itching and rash.  Neurological: Negative for dizziness, tingling, tremors, sensory change, speech change, focal weakness, seizures, loss of consciousness, weakness and headaches.  Endo/Heme/Allergies: Negative for environmental allergies and polydipsia. Does not bruise/bleed easily.  Psychiatric/Behavioral: Negative for depression, suicidal ideas, hallucinations, memory loss and substance abuse. The patient is not nervous/anxious and does not have insomnia.  PHYSICAL EXAM:  Blood pressure 140/82, pulse 92, temperature 98.1 F (36.7 C), temperature source Oral, resp. rate 20, height 5' 2.5" (1.588 m), weight 217 lb (98.431 kg), last menstrual period 02/04/2013, SpO2 100.00%.    Vitals reviewed. Constitutional: She is oriented to person, place, and time. She appears well-developed and well-nourished.  HENT:  Head: Normocephalic and atraumatic.  Right Ear: External ear normal.  Left Ear: External ear  normal.  Nose: Nose normal.  Mouth/Throat: Oropharynx is clear and moist.  Eyes: Conjunctivae and EOM are normal. Pupils are equal, round, and reactive to light. Right eye exhibits no discharge. Left eye exhibits no discharge. No scleral icterus.  Neck: Normal range of motion. Neck supple. No tracheal deviation present. No thyromegaly present.  Cardiovascular: Normal rate, regular rhythm, normal heart sounds and intact distal pulses.  Exam reveals no gallop and no friction rub.   No murmur heard. Respiratory: Effort normal and breath sounds normal. No respiratory distress. She has no wheezes. She has no rales. She exhibits no tenderness.  GI: Soft. Bowel sounds are normal. She exhibits no distension and no mass. There is tenderness. There is no rebound and no guarding.  Genitourinary:       Vulva is normal without lesions Vagina is pink moist without discharge Cervix normal in appearance and pap is normal Uterus is enlarged to term size Adnexa is negative with normal sized ovaries by sonogram  Musculoskeletal: Normal range of motion. She exhibits no edema and no tenderness.  Neurological: She is alert and oriented to person, place, and time. She has normal reflexes. She displays normal reflexes. No cranial nerve deficit. She exhibits normal muscle tone. Coordination normal.  Skin: Skin is warm and dry. No rash noted. No erythema. No pallor.  Psychiatric: She has a normal mood and affect. Her behavior is normal. Judgment and thought content normal.    Labs: Results for orders placed during the hospital encounter of 07/01/14 (from the past 336 hour(s))  PREPARE RBC (CROSSMATCH)   Collection Time    07/01/14 11:15 AM      Result Value Ref Range   Order Confirmation ORDER PROCESSED BY BLOOD BANK    Results for orders placed during the hospital encounter of 06/28/14 (from the past 336 hour(s))  ABO/RH   Collection Time    06/28/14  1:50 PM      Result Value Ref Range   ABO/RH(D) O POS     CBC   Collection Time    06/28/14  2:00 PM      Result Value Ref Range   WBC 9.3  4.0 - 10.5 K/uL   RBC 3.85 (*) 3.87 - 5.11 MIL/uL   Hemoglobin 11.4 (*) 12.0 - 15.0 g/dL   HCT 16.1 (*) 09.6 - 04.5 %   MCV 84.4  78.0 - 100.0 fL   MCH 29.6  26.0 - 34.0 pg   MCHC 35.1  30.0 - 36.0 g/dL   RDW 40.9  81.1 - 91.4 %   Platelets 206  150 - 400 K/uL  RPR   Collection Time    06/28/14  2:00 PM      Result Value Ref Range   RPR NON REAC  NON REAC  TYPE AND SCREEN   Collection Time    06/28/14  2:00 PM      Result Value Ref Range   ABO/RH(D) O POS     Antibody Screen NEG     Sample Expiration 07/01/2014     Unit Number  [id number]     Blood Component Type RED CELLS,LR     Unit division 00     Status of Unit ALLOCATED     Transfusion Status OK TO TRANSFUSE     Crossmatch Result Compatible     Unit Number [id number]     Blood Component Type RED CELLS,LR     Unit division 00     Status of Unit ALLOCATED     Transfusion Status OK TO TRANSFUSE     Crossmatch Result Compatible    Results for orders placed in visit on 06/25/14 (from the past 336 hour(s))  POCT URINALYSIS DIPSTICK   Collection Time    06/25/14  9:50 AM      Result Value Ref Range   Color, UA yellow     Clarity, UA cloudy     Glucose, UA neg     Bilirubin, UA       Ketones, UA neg     Spec Grav, UA       Blood, UA neg     pH, UA       Protein, UA neg     Urobilinogen, UA       Nitrite, UA neg     Leukocytes, UA small (1+)    Results for orders placed in visit on 06/18/14 (from the past 336 hour(s))  POCT URINALYSIS DIPSTICK   Collection Time    06/18/14 10:07 AM      Result Value Ref Range   Color, UA       Clarity, UA       Glucose, UA neg     Bilirubin, UA       Ketones, UA neg     Spec Grav, UA       Blood, UA neg     pH, UA       Protein, UA neg     Urobilinogen, UA       Nitrite, UA neg     Leukocytes, UA Negative      EKG: No orders found for this or any previous visit.  Imaging  Studies: No results found.    Assessment: 1684w0d Estimated Date of Delivery: 07/08/14  Previous Caesarean section Declines TOL   Patient Active Problem List   Diagnosis Date Noted  . Encounter for supervision of other normal pregnancy in third trimester 06/25/2014  . 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 Supervision of normal pregnancy 11/26/2013  . H/O: cesarean section 11/26/2013  . Nausea and vomiting in pregnancy 11/26/2013  . Vaginal bleeding in pregnant patient at less than [redacted] weeks gestation 11/12/2013    Plan: Repeat Caesarean section  Lazaro ArmsURE,LUTHER H 07/01/2014 12:26 PM

## 2014-07-02 ENCOUNTER — Encounter (HOSPITAL_COMMUNITY): Payer: Self-pay | Admitting: Obstetrics & Gynecology

## 2014-07-02 LAB — TYPE AND SCREEN
ABO/RH(D): O POS
Antibody Screen: NEGATIVE
UNIT DIVISION: 0
UNIT DIVISION: 0

## 2014-07-02 LAB — CBC
HCT: 26.7 % — ABNORMAL LOW (ref 36.0–46.0)
Hemoglobin: 9.2 g/dL — ABNORMAL LOW (ref 12.0–15.0)
MCH: 29.3 pg (ref 26.0–34.0)
MCHC: 34.5 g/dL (ref 30.0–36.0)
MCV: 85 fL (ref 78.0–100.0)
Platelets: 171 10*3/uL (ref 150–400)
RBC: 3.14 MIL/uL — ABNORMAL LOW (ref 3.87–5.11)
RDW: 13 % (ref 11.5–15.5)
WBC: 7.8 10*3/uL (ref 4.0–10.5)

## 2014-07-02 LAB — BIRTH TISSUE RECOVERY COLLECTION (PLACENTA DONATION)

## 2014-07-02 MED ORDER — OXYCODONE-ACETAMINOPHEN 5-325 MG PO TABS: 2.0000 | ORAL_TABLET | ORAL | Status: DC | PRN

## 2014-07-02 MED ORDER — LACTATED RINGERS IV SOLN: INTRAVENOUS | Status: DC

## 2014-07-02 MED ORDER — SENNOSIDES-DOCUSATE SODIUM 8.6-50 MG PO TABS: 2.0000 | ORAL_TABLET | ORAL | Status: DC

## 2014-07-02 MED ORDER — SIMETHICONE 80 MG PO CHEW: 80.0000 mg | CHEWABLE_TABLET | ORAL | Status: DC

## 2014-07-02 MED ORDER — DIPHENHYDRAMINE HCL 25 MG PO CAPS: 25.0000 mg | ORAL_CAPSULE | Freq: Four times a day (QID) | ORAL | Status: DC | PRN

## 2014-07-02 MED ORDER — TETANUS-DIPHTH-ACELL PERTUSSIS 5-2.5-18.5 LF-MCG/0.5 IM SUSP: 0.5000 mL | Freq: Once | INTRAMUSCULAR | Status: DC

## 2014-07-02 MED ORDER — SIMETHICONE 80 MG PO CHEW: 80.0000 mg | CHEWABLE_TABLET | ORAL | Status: DC | PRN

## 2014-07-02 MED ORDER — PRENATAL MULTIVITAMIN CH: 1.0000 | ORAL_TABLET | Freq: Every day | ORAL | Status: DC

## 2014-07-02 MED ORDER — LANOLIN HYDROUS EX OINT: 1.0000 "application " | TOPICAL_OINTMENT | CUTANEOUS | Status: DC | PRN

## 2014-07-02 MED ORDER — MENTHOL 3 MG MT LOZG: 1.0000 | LOZENGE | OROMUCOSAL | Status: DC | PRN

## 2014-07-02 MED ORDER — OXYCODONE-ACETAMINOPHEN 5-325 MG PO TABS: 1.0000 | ORAL_TABLET | ORAL | Status: DC | PRN

## 2014-07-02 MED ORDER — OXYCODONE-ACETAMINOPHEN 5-325 MG PO TABS
1.0000 | ORAL_TABLET | ORAL | Status: DC | PRN
Start: 2014-07-02 — End: 2014-07-02

## 2014-07-02 MED ORDER — ONDANSETRON HCL 4 MG/2ML IJ SOLN: 4.0000 mg | INTRAMUSCULAR | Status: DC | PRN

## 2014-07-02 MED ORDER — OXYTOCIN 40 UNITS IN LACTATED RINGERS INFUSION - SIMPLE MED: 62.5000 mL/h | INTRAVENOUS | Status: AC

## 2014-07-02 MED ORDER — IBUPROFEN 600 MG PO TABS: 600.0000 mg | ORAL_TABLET | Freq: Four times a day (QID) | ORAL | Status: DC

## 2014-07-02 MED ORDER — ZOLPIDEM TARTRATE 5 MG PO TABS: 5.0000 mg | ORAL_TABLET | Freq: Every evening | ORAL | Status: DC | PRN

## 2014-07-02 MED ORDER — ZOLPIDEM TARTRATE 5 MG PO TABS
5.0000 mg | ORAL_TABLET | Freq: Every evening | ORAL | Status: DC | PRN
Start: 2014-07-02 — End: 2014-07-02

## 2014-07-02 MED ORDER — OXYTOCIN 40 UNITS IN LACTATED RINGERS INFUSION - SIMPLE MED: 62.5000 mL/h | INTRAVENOUS | Status: DC

## 2014-07-02 MED ORDER — SIMETHICONE 80 MG PO CHEW: 80.0000 mg | CHEWABLE_TABLET | Freq: Three times a day (TID) | ORAL | Status: DC

## 2014-07-02 MED ORDER — ONDANSETRON HCL 4 MG PO TABS
4.0000 mg | ORAL_TABLET | ORAL | Status: DC | PRN
Start: 2014-07-02 — End: 2014-07-02

## 2014-07-02 MED ORDER — ONDANSETRON HCL 4 MG PO TABS: 4.0000 mg | ORAL_TABLET | ORAL | Status: DC | PRN

## 2014-07-02 NOTE — Progress Notes (Signed)
Post Operative Day 1 Subjective: no complaints, up ad lib, voiding, tolerating PO and + flatus   Objective: Blood pressure 116/63, pulse 74, temperature 98.2 F (36.8 C), temperature source Oral, resp. rate 18, height 5' 2.5" (1.588 m), weight 217 lb (98.431 kg), last menstrual period 02/04/2013, SpO2 100.00%, unknown if currently breastfeeding.  Physical Exam:  General: alert, cooperative and no distress Lochia: appropriate Uterine Fundus: firm Incision: healing well, no significant drainage DVT Evaluation: No evidence of DVT seen on physical exam.   Recent Labs  07/02/14 0610  HGB 9.2*  HCT 26.7*    Assessment/Plan: Plan for discharge tomorrow and Contraception Nexplanon at 6 weeks.   LOS: 1 day   OB fellow attestation Post Operative Day 1 I have seen and examined this patient and agree with above documentation in the resident's note.   Kathryn Vasquez is a 23 y.o. [id number] s/p scheduled rLTCS.  Pt denies problems with ambulating, voiding or po intake. Pain is well controlled.  Plan for birth control is Nexplanon.  Method of Feeding: Bottle  PE:  BP 116/63  Pulse 74  Temp(Src) 98.2 F (36.8 C) (Oral)  Resp 18  Ht 5' 2.5" (1.588 m)  Wt 217 lb (98.431 kg)  BMI 39.03 kg/m2  SpO2 100%  LMP 02/04/2013  Breastfeeding? Unknown Fundus firm  Plan for discharge: tomorrow  Kathryn Vasquez, Kathryn Ciriello, MD 9:13 AM

## 2014-07-02 NOTE — Anesthesia Postprocedure Evaluation (Signed)
  Anesthesia Post-op Note  Patient: Kathryn Vasquez  Procedure(s) Performed: Procedure(s): REPEAT CESAREAN SECTION (N/A)  Patient Location: Mother/Baby  Anesthesia Type:Spinal  Level of Consciousness: awake  Airway and Oxygen Therapy: Patient Spontanous Breathing  Post-op Pain: mild  Post-op Assessment: Patient's Cardiovascular Status Stable and Respiratory Function Stable  Post-op Vital Signs: stable  Last Vitals:  Filed Vitals:   07/02/14 0530  BP: 116/63  Pulse: 74  Temp: 36.8 C  Resp: 18    Complications: No apparent anesthesia complications

## 2014-07-02 NOTE — Progress Notes (Signed)
Clinical Social Work Department PSYCHOSOCIAL ASSESSMENT - MATERNAL/CHILD 07/02/2014  Patient:  Kathryn Vasquez, Kathryn Vasquez  Account Number:  [id number]  Admit Date:  07/01/2014  Ardine Eng Name:   Rubin Payor   Clinical Social Worker:  Lucita Ferrara, CLINICAL SOCIAL WORKER   Date/Time:  07/02/2014 09:30 AM  Date Referred:  07/01/2014   Referral source  Central Nurs[facility]     Referred reason  Depression/Anxiety   Other referral source:    I:  FAMILY / [address] legal guardian:  PARENT  Guardian - Name Guardian - Age [place] [address] [place], [place] [address]  Albertine Grates  same as above   Other household support members/support persons Name Relationship DOB  Palmer SON 76 years old   Other support:   MOB stated that they have numerous family members and friends who are supportive. MOB identified the FOB as her primary support person who assists her to emotionally regulate when she begins to feel anxious.    II  PSYCHOSOCIAL DATA Information Source:  Family Interview  Occupational hygienist Employment:   MOB stays at home.  The FOB stated that he is a Geophysical data processor and works primarily evenings.  Per the FOB, he intends to start applying for new jobs since he only works part-time.   Financial resources:  Medicaid If Medicaid - County:  [place] / Grade:  N/A Music therapist / Child Services Coordination / Early Interventions:   N/A  Cultural issues impacting care:   None reported    III  STRENGTHS Strengths  Adequate Resources  Home prepared for Child (including basic supplies)  Supportive family/friends   Strength comment:  MOB stated that their families have ensured that all basic items have been secured for the baby.  When CSW entered the MOB's room, she was on the phone scheduling the baby's first MD appointment after discharge.   IV  RISK FACTORS AND CURRENT PROBLEMS Current  Problem:  YES   Risk Factor & Current Problem Patient Issue Family Issue Risk Factor / Current Problem Comment  Mental Illness Y N MOB presents with history of anxiety and depression.  MOB endrosed symptoms during her pregnancy, and denied current treatment.         V  SOCIAL WORK ASSESSMENT CSW met with the MOB in her room in order to complete the assessment. Consult was ordered due to MOB presenting with a history of anxiety and depression.  MOB provided consent for the FOB to be present for the visit.  MOB and FOB presented as easily engaged and receptive to the visit.  MOB presented in a pleasant mood and displayed a full range in affect.  MOB openly discussed her anxiety and depression, and expressed motivation to receive treatment for her symptoms during the postpartum period.   CSW provided supportive listening and emotional support throughout the visit.  CSW inquired about how family is transitioning into the postpartum period.  MOB stated that she is excited to have a baby again, but FOB stated that he has already noted that he is more nervous than normal.  He reflected upon difficulty sleeping on 10/18 since he wanted to make sure that "nothing bad happened".  The FOB stated that this is his first time with a newborn.  CSW normalized his feelings, and explored normative thoughts and feelings as he transitions into father.  MOB stated that she is not  overwhelmed or anxious to have a baby since she has a 20 year old and feels comfortable with caring for a baby.  MOB and FOB confirmed that the home is all prepared for the baby due to a strong support system.  FOB reflected upon financial stress since he works only part-time at a minimum wage job.  He stated that they are grateful for family support who are ensuring that they have sufficient supplies for the baby.  MOB identified their financial situation as the biggest stressor during her pregnancy. She reflected upon how she frequently felt  overwhelmed since she was frequently worrying about her ability to provide for her family. She denied currently stressing about finances, but shared that it is likely due to the FOB being supportive and helping her to realize that "it will be okay".    MOB originally minimized her feelings of anxiety and depression.  She reported that she "did okay" with her symptoms during her pregnancy.  CSW inquired about the panic attacks, shortness of breath, and chest pain that had been documented in her chart.  Once confronted with this information, she was more receptive to openly talk about her symptoms. MOB recalled feeling anxious on a daily basis, and needing to take "time outs" by walking away from the situation in order to take a few breaths.  MOB identified financial stress as primary trigger for her anxiety.  MOB presented with self-awareness as she was able to identify how she knows that she is starting to feel anxious, but stated that she is only sometimes able to self-regulate when she notes that her anxiety is worsening.  MOB stated that she is not overwhelmed/concerned about her anxiety with a newborn since the FOB is home during the day.  CSW inquired about the evenings that she does not work, and she reported that she is unsure what she will do if she feels overwhelmed.  MOB was receptive to recommendation to create a plan of who she can talk and how she can respond if she notes increase in anxiety when she is home alone.     Per MOB, she has a long history of therapy and medication management to address her symptoms.  She reported that she started therapy at age 61 to assist with grieve the loss of her father.  She shared that she has been tried on numerous medications, and shared belief that Celexa was effective in helping with symptom control.  MOB reported that she was on medications prior to her pregnancy, but discontinued when she learned that she was pregnant.  MOB presented with motivation to  re-start medications and she reflected upon goal of speaking with her MD on 10/23 to discuss her medications.  MOB is able to reflect upon potential consequences if she does not re-start her medications, and expressed awareness that in order to be the best mother possible, she needs to treat her anxiety.  MOB also reflected upon prior history of postpartum depression and expressed goal of starting to medications to reduce her risk of developing postpartum depression.   No barriers to discharge.   VI SOCIAL WORK PLAN Social Work Therapist, art  No Further Intervention Required / No Barriers to Discharge   Type of pt/family education:   Postpartum depression and anxiety   If child protective services report - county:   If child protective services report - date:   Information/referral to community resources comment:   MOB stated that she intends to follow-up  with her PCP on 10/23 to re-start her on her medications.  She declined need for theray referral as she does not believe therapy to be helpful.   Other social work plan:   CSW to provide emotional support PRN.

## 2014-07-02 NOTE — Progress Notes (Signed)
UR chart review completed.  

## 2014-07-02 NOTE — Addendum Note (Signed)
Addendum created 07/02/14 0831 by Renford DillsJanet L Kileigh Ortmann, CRNA   Modules edited: Notes Section   Notes Section:  File: [id number]

## 2014-07-02 NOTE — Plan of Care (Signed)
Problem: Phase II Progression Outcomes Goal: Rh isoimmunization per orders Outcome: Completed/Met Date Met:  07/02/14 Baby A+; -DAT

## 2014-07-02 NOTE — Plan of Care (Signed)
Problem: Discharge Progression Outcomes Goal: Remove staples per MD order Outcome: Not Applicable Date Met:  07/02/14 Per op note incision closed with sutures and Dermabond Goal: MMR given as ordered Outcome: Not Applicable Date Met:  07/02/14 Rubella immune     

## 2014-07-03 MED ORDER — IBUPROFEN 600 MG PO TABS: 600.0000 mg | ORAL_TABLET | Freq: Four times a day (QID) | ORAL | Status: DC

## 2014-07-03 MED ORDER — OXYCODONE-ACETAMINOPHEN 5-325 MG PO TABS: 1.0000 | ORAL_TABLET | ORAL | Status: DC | PRN

## 2014-07-03 NOTE — Discharge Summary (Signed)
Attestation of Attending Supervision of Advanced Practitioner (CNM/NP): Evaluation and management procedures were performed by the Advanced Practitioner under my supervision and collaboration.  I have reviewed the Advanced Practitioner's note and chart, and I agree with the management and plan.  Kathryn Vasquez 07/03/2014 9:46 AM

## 2014-07-03 NOTE — Discharge Summary (Signed)
Obstetric Discharge Summary Reason for Admission: cesarean section Prenatal Procedures: none Intrapartum Procedures: cesarean: low cervical, transverse Postpartum Procedures: none Complications-Operative and Postpartum: none  Hospital Course:  Patient was admitted for repeat cesarean after previous cesarean for arrest of descent. Patient declined TOLAC.  Delivery Note At 1:18 PM a viable female was delivered via C-Section, Low Transverse (Presentation: ;  ).  APGAR: 9, 9; weight 8 lb 1 oz (3657 g).   Placenta status: Intact, Manual removal.  Cord: 3 vessels with the following complications: None.  Cord pH: n/a  Mom to postpartum.  Baby to Couplet care / Skin to Skin.       Postpartum, the patient did well with no complications.  On the day of discharge,  Pt denied problems with ambulating, voiding or po intake.  She denied nausea or vomiting.  Pain wass well controlled.  She has had flatus. She has had bowel movement.  Lochia Small.  Plan for birth control is nexplanon.    H/H: Lab Results  Component Value Date/Time   HGB 9.2* 07/02/2014  6:10 AM   HCT 26.7* 07/02/2014  6:10 AM    Filed Vitals:   07/03/14 0610  BP: 117/67  Pulse: 69  Temp: 97.9 F (36.6 C)  Resp: 18    Physical Exam: VSS NAD Abd: Appropriately tender, ND, Fundus firm Incision: Dressing clean, dry, and intact No c/c/e, Neg homan's sign, neg cords Lochia Appropriate  Discharge Diagnoses: Term Pregnancy-delivered  Discharge Information: Date: 07/03/2014 Activity: pelvic rest Diet: routine  Medications: Tylenol #3, Ibuprofen and Percocet Breast feeding:  No: Bottle Condition: stable Instructions: refer to handout Discharge to: home      Medication List         acetaminophen 500 MG tablet  Commonly known as:  TYLENOL  Take 500 mg by mouth every 6 (six) hours as needed for moderate pain or headache.     calcium carbonate 500 MG chewable tablet  Commonly known as:  TUMS - dosed in mg  elemental calcium  Chew 1-2 tablets by mouth daily as needed for indigestion or heartburn.     ibuprofen 600 MG tablet  Commonly known as:  ADVIL,MOTRIN  Take 1 tablet (600 mg total) by mouth every 6 (six) hours.     multivitamin-prenatal 27-0.8 MG Tabs tablet  Take 1 tablet by mouth daily at 12 noon.     oxyCODONE-acetaminophen 5-325 MG per tablet  Commonly known as:  PERCOCET/ROXICET  Take 1 tablet by mouth every 4 (four) hours as needed (for pain scale less than 7).       Follow-up Information   Follow up with [facility]. Schedule an appointment as soon as possible for a visit in 6 weeks. (for post partum follow up)    Contact information:   [address] [place] [id number] [phone]      Kathryn Vasquez, Caleb 07/03/2014,7:18 AM     OB fellow attestation Post Partum Day 2 I have seen and examined this patient and agree with above documentation in the residents's note.   Kathryn Vasquez is a 23 y.o. [phone] s/p rLTCS.  Pt denies problems with ambulating, voiding or po intake. Pain is well controlled.  PE:  BP 117/67  Pulse 69  Temp(Src) 97.9 F (36.6 C) (Oral)  Resp 18  Ht 5' 2.5" (1.588 m)  Wt 217 lb (98.431 kg)  BMI 39.03 kg/m2  SpO2 100%  LMP 02/04/2013  Breastfeeding? Unknown Fundus firm  Plan for discharge:  today  Perry MountACOSTA,Gates Jividen ROCIO, MD 9:07 AM

## 2014-07-03 NOTE — Discharge Instructions (Signed)

## 2014-07-09 ENCOUNTER — Encounter: Payer: Self-pay | Admitting: Obstetrics & Gynecology

## 2014-07-09 ENCOUNTER — Ambulatory Visit (INDEPENDENT_AMBULATORY_CARE_PROVIDER_SITE_OTHER): Payer: Medicaid Other | Admitting: Obstetrics & Gynecology

## 2014-07-09 VITALS — BP 130/80 | Wt 205.0 lb

## 2014-07-09 DIAGNOSIS — Z9889 Other specified postprocedural states: Secondary | ICD-10-CM

## 2014-07-09 DIAGNOSIS — Z98891 History of uterine scar from previous surgery: Secondary | ICD-10-CM

## 2014-07-09 NOTE — Progress Notes (Signed)
Patient ID: Kathryn DeedJosie Mae Vasquez, female   DOB: 21-Sep-1990, 23 y.o.   MRN: [id number] 1 week post op from Caesarean section No complaints  Incision clean dry intact GV placed  Follow up 5 weeks pp exam  Blood pressure 130/80, weight 205 lb (92.987 kg), last menstrual period 02/04/2013, unknown if currently breastfeeding.

## 2014-07-15 ENCOUNTER — Encounter: Payer: Self-pay | Admitting: Obstetrics & Gynecology

## 2014-07-29 ENCOUNTER — Encounter: Payer: Self-pay | Admitting: Obstetrics & Gynecology

## 2014-07-29 ENCOUNTER — Ambulatory Visit (INDEPENDENT_AMBULATORY_CARE_PROVIDER_SITE_OTHER): Payer: Medicaid Other | Admitting: Obstetrics & Gynecology

## 2014-07-29 VITALS — BP 140/80 | Wt 210.0 lb

## 2014-07-29 DIAGNOSIS — N939 Abnormal uterine and vaginal bleeding, unspecified: Secondary | ICD-10-CM

## 2014-07-29 DIAGNOSIS — N853 Subinvolution of uterus: Secondary | ICD-10-CM

## 2014-07-29 LAB — POCT HEMOGLOBIN: HEMOGLOBIN: 11.7 g/dL — AB (ref 12.2–16.2)

## 2014-07-29 MED ORDER — METHYLERGONOVINE MALEATE 0.2 MG PO TABS: ORAL_TABLET | ORAL | Status: DC

## 2014-07-29 MED ORDER — CIPROFLOXACIN HCL 500 MG PO TABS: 500.0000 mg | ORAL_TABLET | Freq: Two times a day (BID) | ORAL | Status: DC

## 2014-07-29 NOTE — Progress Notes (Signed)
Patient ID: Kathryn Vasquez, female   DOB: 11-11-1990, 23 y.o.   MRN: [id number] Status post repeat Caesarean section 10/19, has had a significant increase in bleeding starting 2 days prior, now changing pads q30 minutes No sex Did not breast feed  Blood pressure 140/80, weight 210 lb (95.255 kg), unknown if currently breastfeeding.   Abdomen soft nt Incision clean dry intact Uterus subinvoluted and a little tender  Subinvolution ? Source  Ciprofloxacin mehtergine 0.2 mg TID x 4 days

## 2014-08-13 ENCOUNTER — Ambulatory Visit (INDEPENDENT_AMBULATORY_CARE_PROVIDER_SITE_OTHER): Payer: Medicaid Other | Admitting: Obstetrics & Gynecology

## 2014-08-13 ENCOUNTER — Encounter: Payer: Self-pay | Admitting: Obstetrics & Gynecology

## 2014-08-13 DIAGNOSIS — Z3200 Encounter for pregnancy test, result unknown: Secondary | ICD-10-CM

## 2014-08-13 DIAGNOSIS — Z3202 Encounter for pregnancy test, result negative: Secondary | ICD-10-CM

## 2014-08-13 LAB — POCT URINE PREGNANCY: PREG TEST UR: NEGATIVE

## 2014-08-13 MED ORDER — ESCITALOPRAM OXALATE 20 MG PO TABS: 20.0000 mg | ORAL_TABLET | Freq: Every day | ORAL | Status: DC

## 2014-08-13 NOTE — Progress Notes (Signed)
Patient ID: Kathryn Vasquez, female   DOB: 25-Dec-1990, 23 y.o.   MRN: [id number] Subjective:     Kathryn Vasquez is a 23 y.o. female who presents for a postpartum visit. She is 6 weeks postpartum following a low cervical transverse Cesarean section. I have fully reviewed the prenatal and intrapartum course. The delivery was at 39 gestational weeks. Outcome: repeat cesarean section, low transverse incision. Anesthesia: spinal. Postpartum course has been significant for uterine subinvolution. Baby's course has been unremarkable. Baby is feeding by bottle - Similac Advance. Bleeding staining only. Bowel function is normal. Bladder function is normal. Patient is not sexually active. Contraception method is wants nexplanon. Postpartum depression screening: positive but improving, does  feel she needs meds at this point, has been on meds before  The following portions of the patient's history were reviewed and updated as appropriate: allergies, current medications, past family history, past medical history, past social history, past surgical history and problem list.  Review of Systems Pertinent items are noted in HPI.   Objective:    BP 120/76 mmHg  Ht 5\' 3"  (1.6 m)  Wt 208 lb (94.348 kg)  BMI 36.85 kg/m2  Breastfeeding? No  General:  alert, cooperative and no distress   Breasts:    Lungs:   Heart:    Abdomen: Incision clean dry intact   Vulva:  normal  Vagina: normal vagina  Cervix:  normal  Corpus: normal size, contour, position, consistency, mobility, non-tender  Adnexa:  normal adnexa  Rectal Exam:         Assessment:     normal postpartum exam. Pap smear not done at today's visit.   Plan:    1. Contraception: Nexplanon 2. Will start lexapro 20 3. Follow up in: 2 weeks or as needed.

## 2014-09-03 ENCOUNTER — Encounter: Payer: Self-pay | Admitting: Obstetrics & Gynecology

## 2014-09-03 ENCOUNTER — Ambulatory Visit (INDEPENDENT_AMBULATORY_CARE_PROVIDER_SITE_OTHER): Payer: Medicaid Other | Admitting: Obstetrics & Gynecology

## 2014-09-03 VITALS — BP 130/84 | Ht 63.0 in | Wt 206.0 lb

## 2014-09-03 DIAGNOSIS — Z3202 Encounter for pregnancy test, result negative: Secondary | ICD-10-CM

## 2014-09-03 DIAGNOSIS — Z30017 Encounter for initial prescription of implantable subdermal contraceptive: Secondary | ICD-10-CM

## 2014-09-03 DIAGNOSIS — Z30018 Encounter for initial prescription of other contraceptives: Secondary | ICD-10-CM | POA: Diagnosis not present

## 2014-09-03 LAB — POCT URINE PREGNANCY: Preg Test, Ur: NEGATIVE

## 2014-09-03 MED ORDER — ALPRAZOLAM 0.5 MG PO TABS: 0.5000 mg | ORAL_TABLET | Freq: Two times a day (BID) | ORAL | Status: DC | PRN

## 2014-09-03 NOTE — Progress Notes (Signed)
Patient ID: Kathryn Vasquez, female   DOB: Feb 17, 1991, 23 y.o.   MRN: [id number]   Nexplanon Insertion Note:  [id number]  UPT: negative  Pt presents desiring placement of LARC in the form of Nexplanon She has been counseled regarding various methods including OCP, nuva ring, levonorgestrel IUD, Depo Provera injections and Nexplanon.  She has chosen Nexplanon and understands its primary unpleasant side effect of irregular bleeding.  The left arm is inspected and is appropriate for placement. The area is prepped with betadine. 3 cc 1% lidocaine is injected at the proposed injection site.  A small stab incision is made with a #11 blade. The Nexplanon device is then inserted into the previously anesthetized area. It is released without difficulty. It is palpable by both patient and myself. There is minimal bleeding.  The stab incision is reapproximated with 3 steri strips. A wrap gauze dressing is placed which patient will leave in place for 2 days or so.

## 2014-12-03 ENCOUNTER — Ambulatory Visit (INDEPENDENT_AMBULATORY_CARE_PROVIDER_SITE_OTHER): Payer: Medicaid Other | Admitting: Obstetrics & Gynecology

## 2014-12-03 ENCOUNTER — Encounter: Payer: Self-pay | Admitting: Obstetrics & Gynecology

## 2014-12-03 VITALS — BP 122/70 | HR 76 | Wt 208.0 lb

## 2014-12-03 DIAGNOSIS — N939 Abnormal uterine and vaginal bleeding, unspecified: Secondary | ICD-10-CM

## 2014-12-03 MED ORDER — ESTRADIOL 2 MG PO TABS: 2.0000 mg | ORAL_TABLET | Freq: Every day | ORAL | Status: DC

## 2014-12-03 MED ORDER — MEGESTROL ACETATE 40 MG PO TABS: ORAL_TABLET | ORAL | Status: DC

## 2014-12-03 NOTE — Progress Notes (Signed)
Patient ID: Kathryn Vasquez, female   DOB: [birth year], 24 y.o.   MRN: [id number]   Chief Complaint  Patient presents with  . Follow-up    has nexplanol/bleeding since January.Taking Lexapro.     HPI:    24 y.o. [id number] Patient's last menstrual period was 09/22/2014.  Daily bleeding since 09/22/2014 Location:  Vaginal bleeding. Quality:  Pink to bright red Severity:  Variable amount. Timing:  daily. Duration:  2+ months. Context:  Nexplanon placed post partum. Modifying factors:   Signs/Symptoms:      Current outpatient prescriptions:  .  acetaminophen (TYLENOL) 500 MG tablet, Take 500 mg by mouth every 6 (six) hours as needed for moderate pain or headache. , Disp: , Rfl:  .  ALPRAZolam (XANAX) 0.5 MG tablet, Take 1 tablet (0.5 mg total) by mouth 2 (two) times daily as needed for anxiety., Disp: 60 tablet, Rfl: 1 .  escitalopram (LEXAPRO) 20 MG tablet, Take 1 tablet (20 mg total) by mouth daily., Disp: 30 tablet, Rfl: 3 .  estradiol (ESTRACE) 2 MG tablet, Take 1 tablet (2 mg total) by mouth daily., Disp: 14 tablet, Rfl: 0 .  megestrol (MEGACE) 40 MG tablet, Take 2 a day, Disp: 60 tablet, Rfl: 3  Problem Pertinent ROS:       No burning with urination, frequency or urgency No nausea, vomiting or diarrhea Nor fever chills or other constitutional symptoms   Extended ROS:     No to minimal dysmenorrhea   PMFSH:             Past Medical History  Diagnosis Date  . Depression   . Anxiety   . First trimester bleeding 04/16/2013  . Pregnancy   . Pregnant 11/12/2013  . Vaginal bleeding in pregnant patient at less than [redacted] weeks gestation 11/12/2013  . Supervision of normal pregnancy 11/26/2013  . H/O: cesarean section 11/26/2013  . Nausea and vomiting in pregnancy 11/26/2013  . GERD (gastroesophageal reflux disease)     with pregnancy - tx with tums  . Migraine     no meds, just sleep/rest  . Anemia     Hx    Past Surgical History  Procedure Laterality Date  . Cesarean section   2010    x 1 WH  . Bladder stretch      at age 42 yrs  . Cesarean section N/A 07/01/2014    Procedure: REPEAT CESAREAN SECTION;  Surgeon: Lazaro ArmsLuther H Eure, MD;  Location: [facility];  Service: Obstetrics;  Laterality: N/A;    OB History    Gravida Para Term Preterm AB TAB SAB Ectopic Multiple Living   3 2 2  0 1 0 1 0 0 2      Allergies  Allergen Reactions  . Ceclor [Cefaclor] Rash  . Keflex [Cephalexin] Rash  . Latex Rash    History   Social History  . Marital Status: Single    Spouse Name: N/A  . Number of Children: N/A  . Years of Education: N/A   Social History Main Topics  . Smoking status: Former Smoker -- 0.50 packs/day for 1 years    Types: Cigarettes    Quit date: 10/14/2013  . Smokeless tobacco: Never Used  . Alcohol Use: No  . Drug Use: No  . Sexual Activity: Yes    Birth Control/ Protection: Condom     Comment: pregnant   Other Topics Concern  . None   Social History Narrative    Family History  Problem Relation Age  of Onset  . Cancer Maternal Grandmother     breast  . Hypertension Mother      Examination:  Vitals:  Blood pressure 122/70, pulse 76, weight 208 lb (94.348 kg), last menstrual period 09/22/2014, not currently breastfeeding.    Physical Examination:     No exam   DATA orders and reviews: Labs were not ordered today:   Imaging studies were not ordered today:    Lab tests were not reviewed today:    Imaging studies were not reviewed today:    I did not independently review/view images, tracing or specimen(not simply the report) myself.  Prescription Drug Management:  New Prescriptions: Estradiol 2 mg daily x 2 weeks, then megestro 40-80 mg daily x 6 weeks Renewed Prescriptions:   Current prescription changes:     Impression/Plan(Problem Based): 1.  AUB-endometrial origin      (new problem) : Additional workup is needed:  Endometrial synchronization with estradiol followed by megestrol  {2.  Depression      (follow up of a  pre-existing problem:are improving) : Additional workup is not needed:  Doing well on lexapro 20, better than anything she has ever been on     Follow Up:   2  months     Face to face time:  15 minutes  Greater than 50% of the visit time was spent in counseling and coordination of care with the patient.  The summary and outline of the counseling and care coordination is summarized in the note above.   All questions were answered.

## 2015-02-04 ENCOUNTER — Ambulatory Visit (INDEPENDENT_AMBULATORY_CARE_PROVIDER_SITE_OTHER): Payer: Medicaid Other | Admitting: Obstetrics & Gynecology

## 2015-02-04 ENCOUNTER — Encounter: Payer: Self-pay | Admitting: Obstetrics & Gynecology

## 2015-02-04 VITALS — BP 122/76 | HR 86 | Ht 63.0 in | Wt 210.0 lb

## 2015-02-04 DIAGNOSIS — N939 Abnormal uterine and vaginal bleeding, unspecified: Secondary | ICD-10-CM | POA: Diagnosis not present

## 2015-02-04 MED ORDER — ESTRADIOL 2 MG PO TABS: 2.0000 mg | ORAL_TABLET | Freq: Every day | ORAL | Status: DC

## 2015-02-04 NOTE — Progress Notes (Signed)
Patient ID: Kathryn Vasquez, female   DOB: 12/24/1990, 24 y.o.   MRN: [id number] Pt stopped bleeding on the estradiol 2 mg daily and stayed stopped on megestrol since stopping megestrol has been bleeding for about 1 week  Will restart the estradiol 2 mg daily Has stopped lexapro and begun on effexor and topamax  Follow up prn     Face to face time:  15 minutes  Greater than 50% of the visit time was spent in counseling and coordination of care with the patient.  The summary and outline of the counseling and care coordination is summarized in the note above.   All questions were answered.

## 2015-03-14 ENCOUNTER — Encounter: Payer: Self-pay | Admitting: Obstetrics & Gynecology

## 2015-03-14 ENCOUNTER — Ambulatory Visit (INDEPENDENT_AMBULATORY_CARE_PROVIDER_SITE_OTHER): Payer: Medicaid Other | Admitting: Obstetrics & Gynecology

## 2015-03-14 VITALS — BP 100/60 | HR 100 | Wt 210.0 lb

## 2015-03-14 DIAGNOSIS — N939 Abnormal uterine and vaginal bleeding, unspecified: Secondary | ICD-10-CM

## 2015-03-14 DIAGNOSIS — Z3202 Encounter for pregnancy test, result negative: Secondary | ICD-10-CM

## 2015-03-14 LAB — POCT URINE PREGNANCY: PREG TEST UR: NEGATIVE

## 2015-03-14 LAB — POCT HEMOGLOBIN: Hemoglobin: 10.2 g/dL — AB (ref 12.2–16.2)

## 2015-03-14 MED ORDER — MEGESTROL ACETATE 40 MG PO TABS: ORAL_TABLET | ORAL | Status: DC

## 2015-03-14 NOTE — Progress Notes (Signed)
Patient ID: Maud DeedJosie Mae Busbee, female   DOB: 1991-09-03, 24 y.o.   MRN: [id number] Patient ID: Maud DeedJosie Mae Tani, female   DOB: 1991-09-03, 24 y.o.   MRN: [id number]   Chief Complaint  Patient presents with  . Follow-up    on estradiol, not working. vaginal bleeding x 48 day..    Blood pressure 100/60, pulse 100, weight 210 lb (95.255 kg), not currently breastfeeding.  Pt began bleeding after restarting the estradiol Stopped on megestrol  Will restart megestrol and take 3 for 5 2 for 5 then daily  Follow up prn     Face to face time:  10 minutes  Greater than 50% of the visit time was spent in counseling and coordination of care with the patient.  The summary and outline of the counseling and care coordination is summarized in the note above.   All questions were answered.     See below: Pt stopped bleeding on the estradiol 2 mg daily and stayed stopped on megestrol since stopping megestrol has been bleeding for about 1 week  Will restart the estradiol 2 mg daily Has stopped lexapro and begun on effexor and topamax  Follow up prn     Face to face time:  15 minutes  Greater than 50% of the visit time was spent in counseling and coordination of care with the patient.  The summary and outline of the counseling and care coordination is summarized in the note above.   All questions were answered.

## 2015-05-01 IMAGING — US US OB TRANSVAGINAL
1 series · 14 of 28 positions shown · non-contrast
Comparison: None.

CLINICAL DATA: Pregnant, pelvic pain

OBSTETRIC <14 WK US AND TRANSVAGINAL OB US
TECHNIQUE: Both transabdominal and transvaginal ultrasound
examinations were performed for complete evaluation of the
gestation as well as the maternal uterus, adnexal regions, and
pelvic cul-de-sac.  Transvaginal technique was performed to assess
early pregnancy.

[Series 2: us ob transvaginal · 0.21mm/px · 14 of 64 slices shown]
[im 3/64]
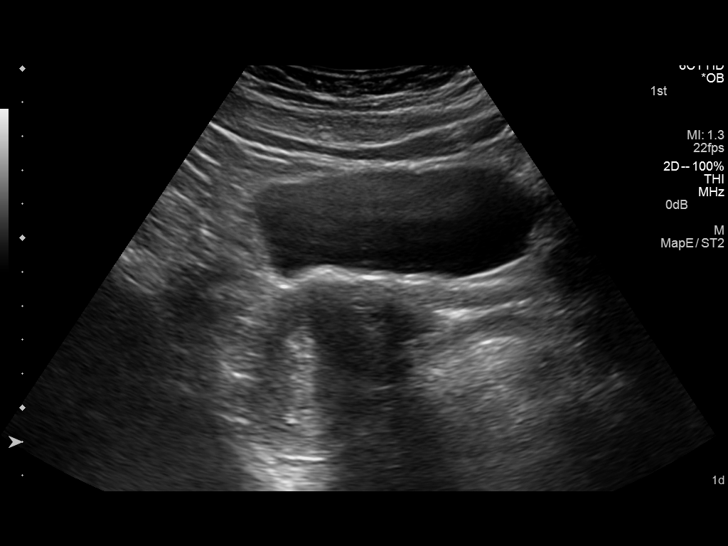
[im 8/64]
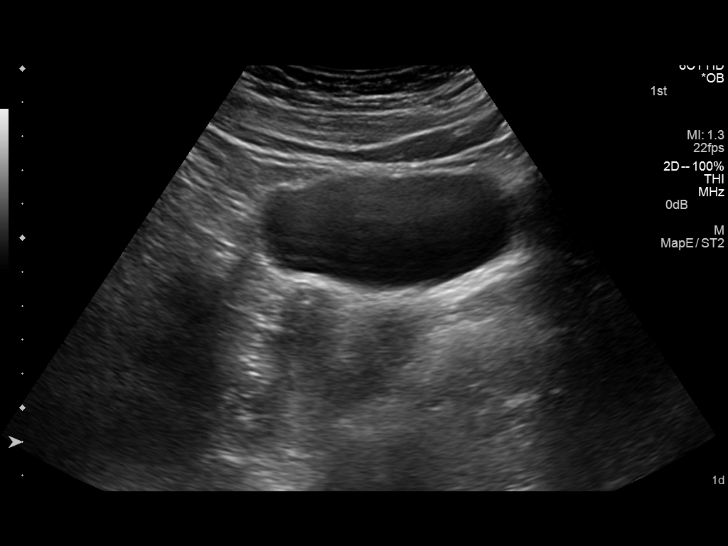
[im 12/64]
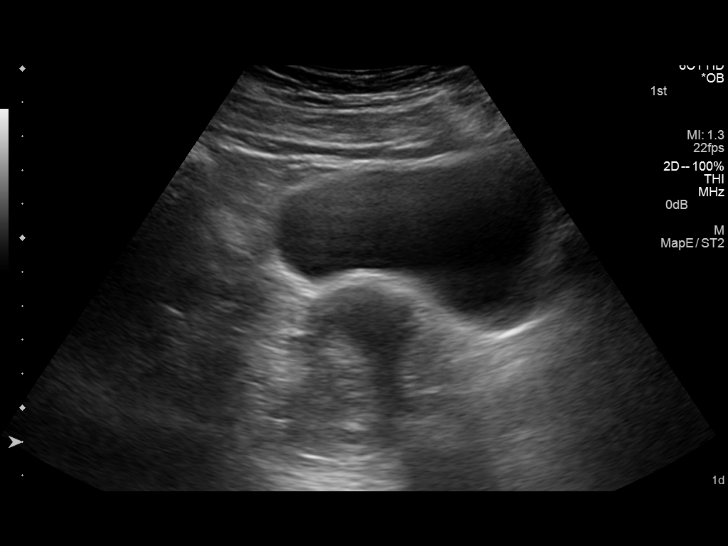
[im 17/64]
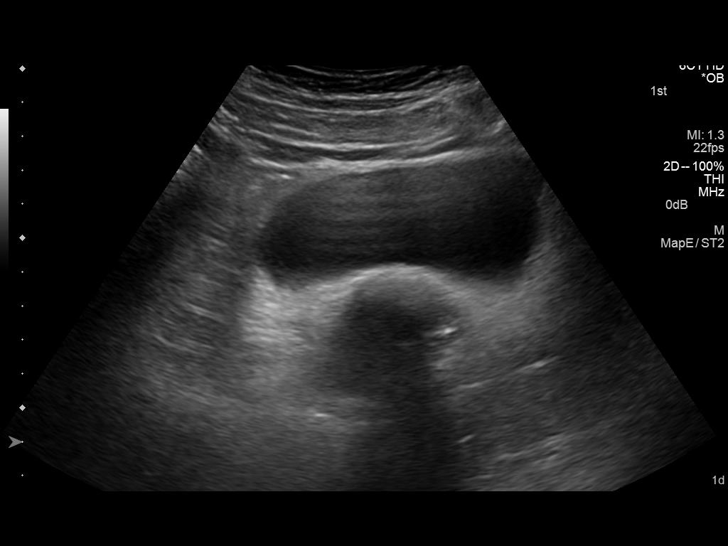
[im 22/64]
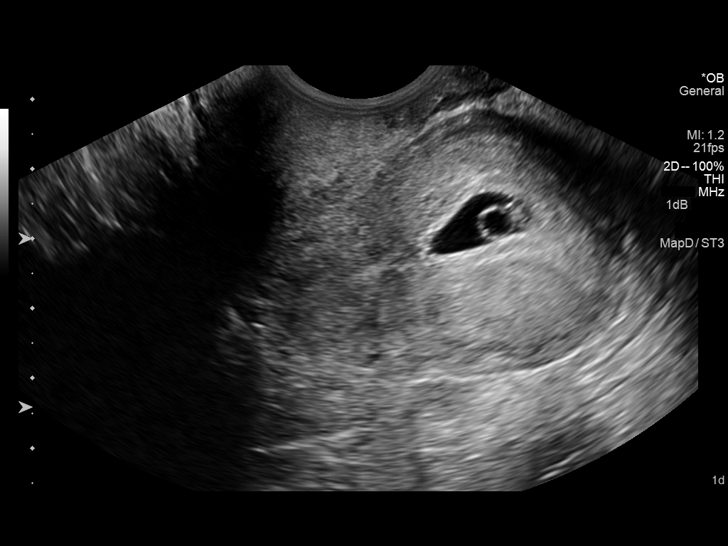
[im 26/64]
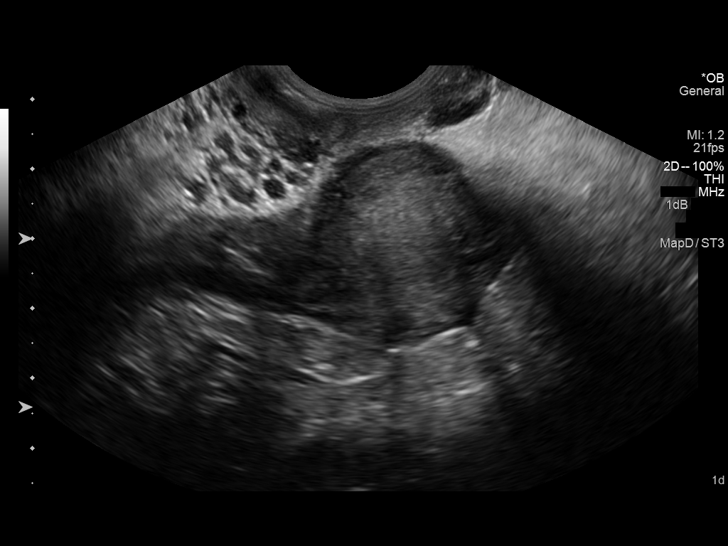
[im 31/64]
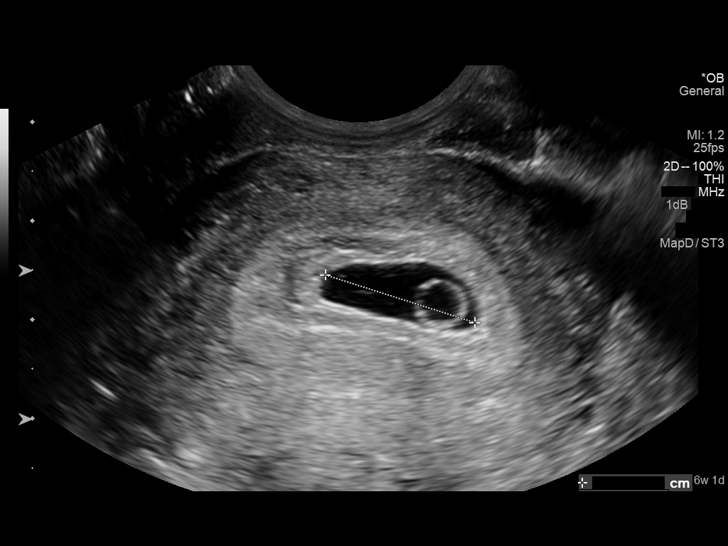
[im 36/64]
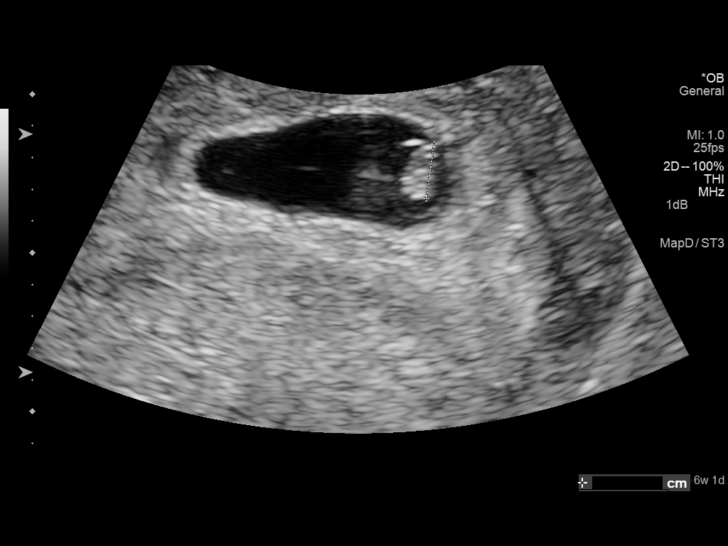
[im 40/64]
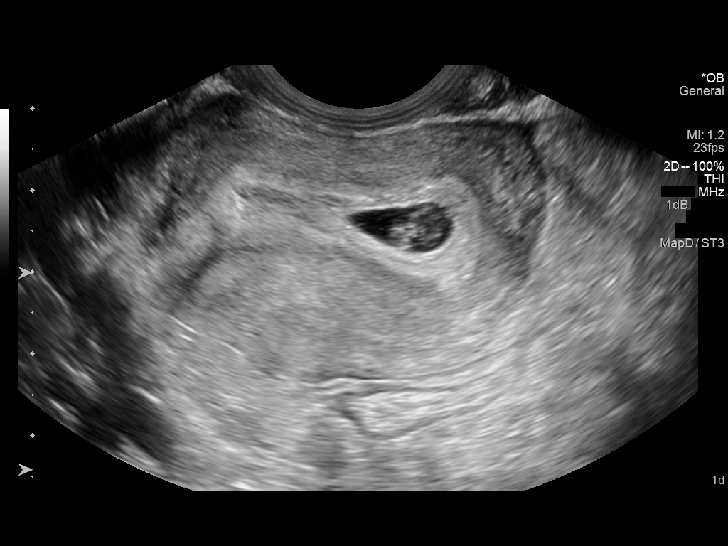
[im 45/64]
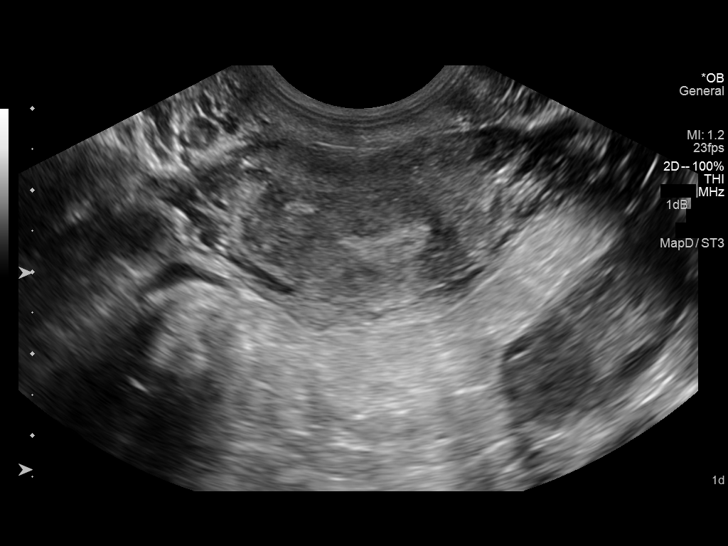
[im 50/64]
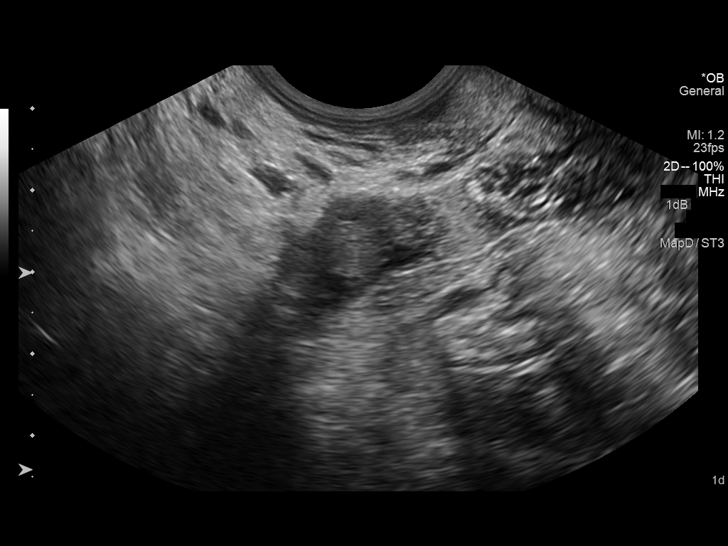
[im 54/64]
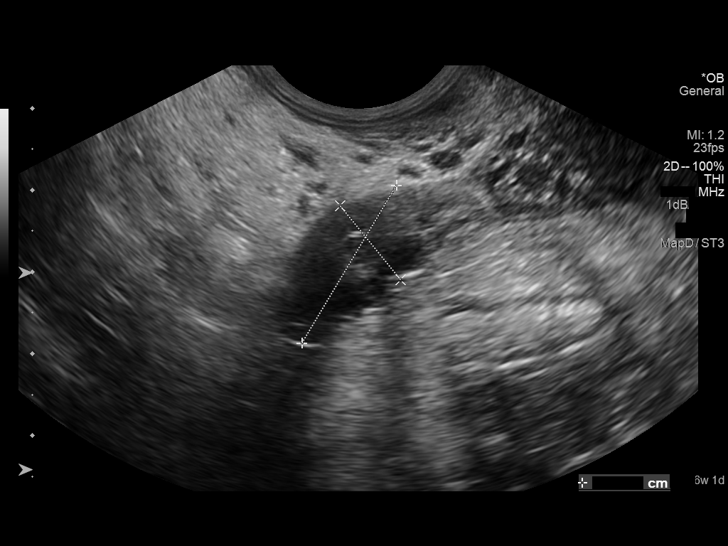
[im 59/64]
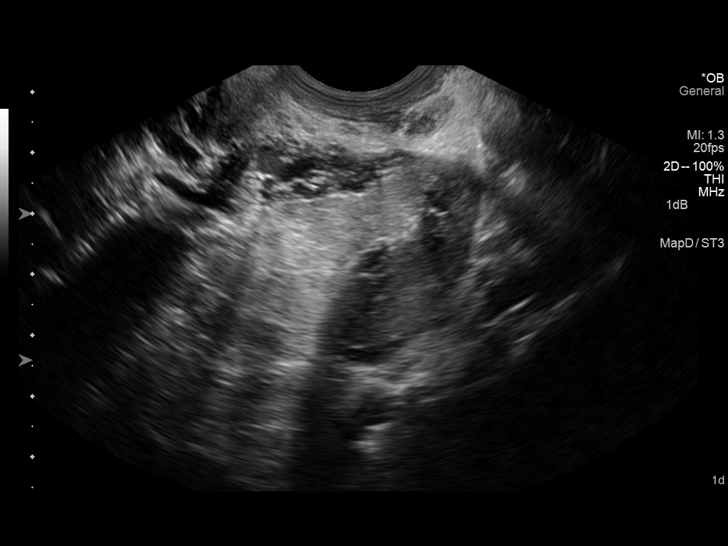
[im 64/64]
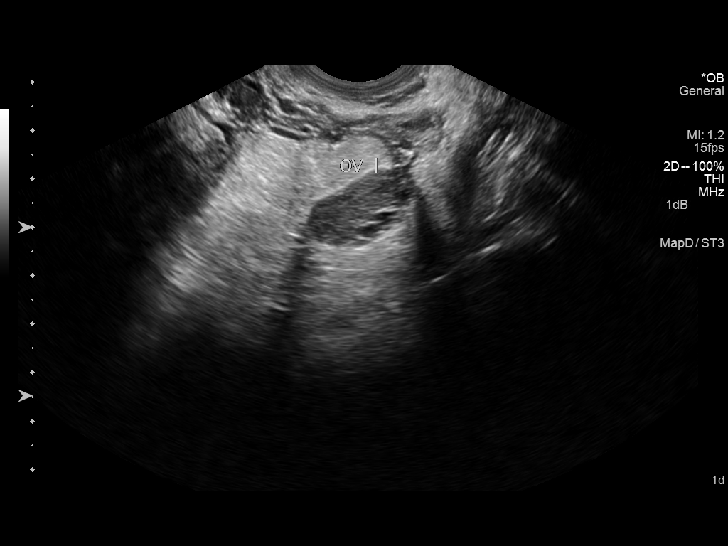

[14 of 28 positions shown; findings below may reference images not displayed]

Intrauterine gestational sac:  Visualized/normal in shape.
Yolk sac: Present
Embryo: Present
Cardiac Activity: Present
Heart Rate: 113 bpm

CRL: 4.7  mm  6 w  1 d        US EDC: 12/07/2013

Maternal uterus/adnexae:
No subchronic hemorrhage.

Right ovary is within normal limits, measuring 2.3 x 1.2 x 1.6 cm.

Left ovary is within normal limits, measuring 3.3 x 1.3 x 2.9 cm.

No free fluid.
IMPRESSION: Single live intrauterine gestation with estimated gestational age 6
weeks 1 day by crown-rump length.

## 2015-09-09 ENCOUNTER — Other Ambulatory Visit: Payer: Self-pay | Admitting: Obstetrics & Gynecology

## 2016-04-29 ENCOUNTER — Other Ambulatory Visit: Payer: Self-pay | Admitting: *Deleted

## 2016-04-29 MED ORDER — MEGESTROL ACETATE 40 MG PO TABS: ORAL_TABLET | ORAL | 11 refills | Status: DC

## 2016-06-02 ENCOUNTER — Other Ambulatory Visit: Payer: Self-pay | Admitting: Obstetrics & Gynecology

## 2017-09-21 ENCOUNTER — Encounter: Payer: Self-pay | Admitting: Women's Health

## 2017-09-22 ENCOUNTER — Encounter: Payer: Medicaid Other | Admitting: Women's Health

## 2017-09-26 NOTE — Progress Notes (Signed)
This encounter was created in error - please disregard.

## 2017-10-12 ENCOUNTER — Encounter: Payer: Medicaid Other | Admitting: Advanced Practice Midwife

## 2017-10-25 ENCOUNTER — Encounter: Payer: Medicaid Other | Admitting: Advanced Practice Midwife

## 2017-11-02 ENCOUNTER — Encounter: Payer: Medicaid Other | Admitting: Advanced Practice Midwife

## 2017-11-16 ENCOUNTER — Encounter: Payer: Medicaid Other | Admitting: Advanced Practice Midwife

## 2017-11-25 ENCOUNTER — Encounter: Payer: Self-pay | Admitting: Women's Health

## 2017-11-25 ENCOUNTER — Ambulatory Visit (INDEPENDENT_AMBULATORY_CARE_PROVIDER_SITE_OTHER): Payer: Medicaid Other | Admitting: Women's Health

## 2017-11-25 VITALS — BP 134/82 | HR 79 | Ht 63.0 in | Wt 230.5 lb

## 2017-11-25 DIAGNOSIS — Z3202 Encounter for pregnancy test, result negative: Secondary | ICD-10-CM | POA: Diagnosis not present

## 2017-11-25 DIAGNOSIS — Z3046 Encounter for surveillance of implantable subdermal contraceptive: Secondary | ICD-10-CM

## 2017-11-25 DIAGNOSIS — Z30017 Encounter for initial prescription of implantable subdermal contraceptive: Secondary | ICD-10-CM

## 2017-11-25 DIAGNOSIS — Z3049 Encounter for surveillance of other contraceptives: Secondary | ICD-10-CM | POA: Diagnosis not present

## 2017-11-25 LAB — POCT URINE PREGNANCY: Preg Test, Ur: NEGATIVE

## 2017-11-25 MED ORDER — ETONOGESTREL 68 MG ~~LOC~~ IMPL
68.0000 mg | DRUG_IMPLANT | Freq: Once | SUBCUTANEOUS | Status: AC
  Administered 2017-11-25: 68 mg via SUBCUTANEOUS

## 2017-11-25 NOTE — Addendum Note (Signed)
Addended by: Colen DarlingYOUNG, Sayan Aldava S on: 11/25/2017 11:32 AM   Modules accepted: Orders

## 2017-11-25 NOTE — Patient Instructions (Signed)

## 2017-11-25 NOTE — Progress Notes (Signed)
   NEXPLANON REMOVAL AND RE-INSERTION Patient name: Kathryn Vasquez MRN [id number]  Date of birth: 1991/05/17 Subjective Findings:   Kathryn Vasquez is a 27 y.o. [id number] Caucasian female being seen today for Nexplanon removal and re-insertion. Her Nexplanon was placed Dec 2015.   Patient's last menstrual period was 11/15/2017 (approximate). Last pap >5484yrs ago. Results were:  normal  Risks/benefits/side effects of Nexplanon have been discussed and her questions have been answered.  Specifically, a failure rate of 09/998 has been reported, with an increased failure rate if pt takes St. John's Wort and/or antiseizure medicaitons.  She is aware of the common side effect of irregular bleeding, which the incidence of decreases over time. Signed copy of informed consent in chart.  Pertinent History Reviewed:   Reviewed past medical,surgical, social, obstetrical and family history.  Reviewed problem list, medications and allergies. Objective Findings & Procedure:    Vitals:   11/25/17 1050  BP: 134/82  Pulse: 79  Weight: 230 lb 8 oz (104.6 kg)  Height: 5\' 3"  (1.6 m)  Body mass index is 40.83 kg/m.  Results for orders placed or performed in visit on 11/25/17 (from the past 24 hour(s))  POCT urine pregnancy   Collection Time: 11/25/17 10:57 AM  Result Value Ref Range   Preg Test, Ur Negative Negative     Time out was performed.  Nexplanon site identified.  Area prepped in usual sterile fashon. Two cc's of 2% lidocaine was used to anesthetize the area. A small stab incision was made right beside the implant on the distal portion.  The Nexplanon rod was grasped using hemostats and removed intact without difficulty.  The area was cleansed again with betadine and the Nexplanon was inserted approximately 10cm from the medial epicondyle and 3-5cm posterior to the sulcus per manufacturer's recommendations without difficulty.  Steri-strips and a pressure bandage was applied.  There was less than 3 cc  blood loss. There were no complications.  The patient tolerated the procedure well. Assessment & Plan:   1) Nexplanon removal & re-insertion She was instructed to keep the area clean and dry, remove pressure bandage in 24 hours, and keep insertion site covered with the steri-strips for 3-5 days.  She was given a card indicating date Nexplanon was inserted and date it needs to be removed.  Follow-up PRN problems.  Orders Placed This Encounter  Procedures  . POCT urine pregnancy    Follow-up: Return for w/in 4wks for , Pap & physical.  Cheral MarkerKimberly R Jodie Cavey CNM, [facility]NP-BC 11/25/2017 11:27 AM

## 2017-12-23 ENCOUNTER — Other Ambulatory Visit: Payer: Medicaid Other | Admitting: Women's Health

## 2020-11-25 ENCOUNTER — Other Ambulatory Visit: Payer: Self-pay

## 2020-11-25 ENCOUNTER — Encounter: Payer: Self-pay | Admitting: Women's Health

## 2020-11-26 NOTE — Progress Notes (Signed)
This encounter was created in error - please disregard. Pt here for nexplanon removal, but really wants another and is self-pay, so can't afford. [facility]iscussed [facility] for removal and reinsertion- pt wants to do this, so today's appt cancelled.
